# Patient Record
Sex: Male | Born: 1935 | Race: White | Hispanic: No | Marital: Married
Health system: Southern US, Community
[De-identification: ages and names within clinical notes are randomized; demographics above are authoritative.]

## PROBLEM LIST (undated history)

## (undated) DIAGNOSIS — I10 Essential (primary) hypertension: Secondary | ICD-10-CM

## (undated) DIAGNOSIS — I2510 Atherosclerotic heart disease of native coronary artery without angina pectoris: Secondary | ICD-10-CM

## (undated) DIAGNOSIS — F0390 Unspecified dementia without behavioral disturbance: Secondary | ICD-10-CM

## (undated) HISTORY — PX: NO PAST SURGERIES: SHX2092

---

## 2019-06-15 ENCOUNTER — Encounter: Payer: Self-pay | Admitting: Emergency Medicine

## 2019-06-15 ENCOUNTER — Other Ambulatory Visit: Payer: Self-pay

## 2019-06-15 ENCOUNTER — Emergency Department
Admission: EM | Admit: 2019-06-15 | Discharge: 2019-06-15 | Disposition: A | Discharge status: Skilled Nursing Facility | Payer: Medicare Other | Attending: Emergency Medicine | Admitting: Emergency Medicine

## 2019-06-15 ENCOUNTER — Emergency Department: Payer: Medicare Other

## 2019-06-15 DIAGNOSIS — M542 Cervicalgia: Secondary | ICD-10-CM | POA: Insufficient documentation

## 2019-06-15 DIAGNOSIS — Z79899 Other long term (current) drug therapy: Secondary | ICD-10-CM | POA: Insufficient documentation

## 2019-06-15 DIAGNOSIS — I2510 Atherosclerotic heart disease of native coronary artery without angina pectoris: Secondary | ICD-10-CM | POA: Diagnosis not present

## 2019-06-15 DIAGNOSIS — W01190A Fall on same level from slipping, tripping and stumbling with subsequent striking against furniture, initial encounter: Secondary | ICD-10-CM | POA: Insufficient documentation

## 2019-06-15 DIAGNOSIS — F0390 Unspecified dementia without behavioral disturbance: Secondary | ICD-10-CM | POA: Diagnosis not present

## 2019-06-15 DIAGNOSIS — Z7901 Long term (current) use of anticoagulants: Secondary | ICD-10-CM | POA: Diagnosis not present

## 2019-06-15 DIAGNOSIS — W19XXXA Unspecified fall, initial encounter: Secondary | ICD-10-CM

## 2019-06-15 DIAGNOSIS — I10 Essential (primary) hypertension: Secondary | ICD-10-CM | POA: Diagnosis not present

## 2019-06-15 DIAGNOSIS — Z7982 Long term (current) use of aspirin: Secondary | ICD-10-CM | POA: Diagnosis not present

## 2019-06-15 HISTORY — DX: Unspecified dementia, unspecified severity, without behavioral disturbance, psychotic disturbance, mood disturbance, and anxiety: F03.90

## 2019-06-15 HISTORY — DX: Atherosclerotic heart disease of native coronary artery without angina pectoris: I25.10

## 2019-06-15 HISTORY — DX: Essential (primary) hypertension: I10

## 2019-06-15 NOTE — ED Provider Notes (Signed)
[facility] Emergency Department Provider Note   ____________________________________________    I have reviewed the triage vital signs and the nursing notes.   HISTORY  Chief Complaint Fall     HPI Glenn Barker is a 85 y.o. male who presents after a fall.  Patient has a history of dementia, history significantly limited because of this.  EMS reports the patient is [facility] after mechanical fall apparently hit his head on a table initially complained of neck pain  Past Medical History:  Diagnosis Date  . Coronary artery disease   . Dementia (HCC)   . Hypertension     There are no active problems to display for this patient.   History reviewed. No pertinent surgical history.  Prior to Admission medications   Medication Sig Start Date End Date Taking? Authorizing Provider  acetaminophen (TYLENOL) 500 MG tablet Take 1,000 mg by mouth 3 (three) times daily.   Yes [provider]  amLODipine (NORVASC) 10 MG tablet Take 10 mg by mouth daily.   Yes [provider]  aspirin EC 81 MG tablet Take 81 mg by mouth every evening.   Yes [provider]  atorvastatin (LIPITOR) 40 MG tablet Take 40 mg by mouth daily.   Yes [provider]  Camphor-Eucalyptus-Menthol (ICY HOT NO-MESS VAPOR GEL EX) Apply 1 application topically 3 (three) times daily.   Yes [provider]  cholecalciferol (VITAMIN D3) 25 MCG (1000 UT) tablet Take 2,000 Units by mouth daily.   Yes [provider]  clopidogrel (PLAVIX) 75 MG tablet Take 75 mg by mouth daily.   Yes [provider]  lisinopril (ZESTRIL) 20 MG tablet Take 20 mg by mouth daily.   Yes [provider]  metoprolol succinate (TOPROL-XL) 50 MG 24 hr tablet Take 50 mg by mouth every evening.   Yes [provider]  potassium chloride SA (K-DUR) 20 MEQ tablet Take 20 mEq by mouth 2 (two) times daily.   Yes [provider]  QUEtiapine (SEROQUEL) 25 MG tablet Take 12.5 mg by mouth 2 (two) times daily.   Yes [provider]  traMADol (ULTRAM) 50 MG tablet Take 50 mg by mouth 2 (two) times daily.   Yes [provider]  venlafaxine (EFFEXOR) 75 MG tablet Take 75 mg by mouth 2 (two) times daily.   Yes [provider]  vitamin B-12 (CYANOCOBALAMIN) 1000 MCG tablet Take 1,000 mcg by mouth daily.   Yes [provider]     Allergies Gabapentin and Pregabalin  No family history on file.  Social History Social History   Tobacco Use  . Smoking status: Unknown If Ever Smoked  Substance Use Topics  . Alcohol use: Not on file  . Drug use: Not on file    Review of Systems limited by dementia     ____________________________________________   PHYSICAL EXAM:  VITAL SIGNS: ED Triage Vitals  Enc Vitals Group     BP 06/15/19 1544 (!) 188/61     Pulse Rate 06/15/19 1544 (!) 53     Resp 06/15/19 1544 18     Temp 06/15/19 1544 98.1 F (36.7 C)     Temp src --      SpO2 06/15/19 1544 98 %     Weight 06/15/19 1545 81.6 kg (180 lb)     Height 06/15/19 1545 1.829 m (6')     Head Circumference --      Peak Flow --  Pain Score 06/15/19 1545 0     Pain Loc --      Pain Edu? --      Excl. in [place]? --     Constitutional: Alert, no acute distress Eyes: Conjunctivae are normal.  Head: No no hematoma, no lacerations Nose: No no epistaxis or swelling Mouth/Throat: Mucous membranes are moist.   Neck: C-collar in place, no vertebral tenderness to palpation, no apparent bruising hematoma or injury to the neck.  No bruit Cardiovascular: Mild bradycardia, regular rhythm   good peripheral circulation.  No chest wall tenderness palpation Respiratory: Normal respiratory effort.  No retractions. Gastrointestinal: Soft and nontender. No distention.  No CVA tenderness.  Musculoskeletal: Normal range of motion of the extremities without pain, no vertebral tenderness to  palpation, no pain with axial load on both hips Neurologic:  Normal speech and language. No gross focal neurologic deficits are appreciated.  Skin:  Skin is warm, dry and intact.  No bruising or hematomas noted Psychiatric: Mood and affect are normal. Speech and behavior are normal.  ____________________________________________   LABS (all labs ordered are listed, but only abnormal results are displayed)  Labs Reviewed - No data to display ____________________________________________  EKG  None ____________________________________________  RADIOLOGY  CT head cervical spine unremarkable ____________________________________________   PROCEDURES  Procedure(s) performed: No  Procedures   Critical Care performed: No ____________________________________________   INITIAL IMPRESSION / ASSESSMENT AND PLAN / ED COURSE  Pertinent labs & imaging results that were available during my care of the patient were reviewed by me and considered in my medical decision making (see chart for details).  Patient presents after mechanical fall, exam is overall reassuring, will obtain imaging of the head and cervical spine given description of head injury and possible neck pain.  CT imaging is reassuring, patient's exam is otherwise unremarkable, appropriate for discharge at this time    ____________________________________________   FINAL CLINICAL IMPRESSION(S) / ED DIAGNOSES  Final diagnoses:  Fall, initial encounter        Note:  This document was prepared using Dragon voice recognition software and may include unintentional dictation errors.   Lavonia Drafts, MD 06/15/19 1739

## 2019-06-15 NOTE — Discharge Instructions (Addendum)
Patient had reassuring ct imaging of head and neck

## 2019-06-15 NOTE — ED Triage Notes (Signed)
Pt to ER via EMS from [facility] after tripping and falling.  Pt with hx of dementia, unable to obtain any information from patient concerning fall at this time.  Reports that pt hit his head on a table as he fell, reports of a hematoma to the side of his neck, c-collar in place covering area in question.

## 2019-08-05 ENCOUNTER — Emergency Department: Payer: Medicare Other

## 2019-08-05 ENCOUNTER — Emergency Department
Admission: EM | Admit: 2019-08-05 | Discharge: 2019-08-05 | Disposition: A | Discharge status: Home / Self Care | Payer: Medicare Other | Attending: Emergency Medicine | Admitting: Emergency Medicine

## 2019-08-05 ENCOUNTER — Other Ambulatory Visit: Payer: Self-pay

## 2019-08-05 DIAGNOSIS — Z7901 Long term (current) use of anticoagulants: Secondary | ICD-10-CM | POA: Diagnosis not present

## 2019-08-05 DIAGNOSIS — Z7982 Long term (current) use of aspirin: Secondary | ICD-10-CM | POA: Insufficient documentation

## 2019-08-05 DIAGNOSIS — Z79899 Other long term (current) drug therapy: Secondary | ICD-10-CM | POA: Diagnosis not present

## 2019-08-05 DIAGNOSIS — R51 Headache: Secondary | ICD-10-CM | POA: Diagnosis present

## 2019-08-05 DIAGNOSIS — I10 Essential (primary) hypertension: Secondary | ICD-10-CM | POA: Diagnosis not present

## 2019-08-05 DIAGNOSIS — I2510 Atherosclerotic heart disease of native coronary artery without angina pectoris: Secondary | ICD-10-CM | POA: Insufficient documentation

## 2019-08-05 DIAGNOSIS — Y999 Unspecified external cause status: Secondary | ICD-10-CM | POA: Diagnosis not present

## 2019-08-05 DIAGNOSIS — F0390 Unspecified dementia without behavioral disturbance: Secondary | ICD-10-CM | POA: Diagnosis not present

## 2019-08-05 DIAGNOSIS — W19XXXA Unspecified fall, initial encounter: Secondary | ICD-10-CM | POA: Diagnosis not present

## 2019-08-05 DIAGNOSIS — Y92129 Unspecified place in nursing home as the place of occurrence of the external cause: Secondary | ICD-10-CM | POA: Insufficient documentation

## 2019-08-05 LAB — COMPREHENSIVE METABOLIC PANEL
ALT: 11 U/L (ref 0–44)
AST: 18 U/L (ref 15–41)
Albumin: 4.3 g/dL (ref 3.5–5.0)
Alkaline Phosphatase: 52 U/L (ref 38–126)
Anion gap: 8 (ref 5–15)
BUN: 19 mg/dL (ref 8–23)
CO2: 28 mmol/L (ref 22–32)
Calcium: 9.7 mg/dL (ref 8.9–10.3)
Chloride: 102 mmol/L (ref 98–111)
Creatinine, Ser: 1.07 mg/dL (ref 0.61–1.24)
GFR calc Af Amer: >60 mL/min (ref >60)
GFR calc non Af Amer: >60 mL/min (ref >60)
Glucose, Bld: 107 mg/dL — ABNORMAL HIGH (ref 70–99)
Potassium: 4.5 mmol/L (ref 3.5–5.1)
Sodium: 138 mmol/L (ref 135–145)
Total Bilirubin: 0.8 mg/dL (ref 0.3–1.2)
Total Protein: 6.9 g/dL (ref 6.5–8.1)

## 2019-08-05 LAB — CBC
HCT: 37.6 % — ABNORMAL LOW (ref 39.0–52.0)
Hemoglobin: 12.3 g/dL — ABNORMAL LOW (ref 13.0–17.0)
MCH: 33.3 pg (ref 26.0–34.0)
MCHC: 32.7 g/dL (ref 30.0–36.0)
MCV: 101.9 fL — ABNORMAL HIGH (ref 80.0–100.0)
Platelets: 240 10*3/uL (ref 150–400)
RBC: 3.69 MIL/uL — ABNORMAL LOW (ref 4.22–5.81)
RDW: 13.2 % (ref 11.5–15.5)
WBC: 6.3 10*3/uL (ref 4.0–10.5)
nRBC: 0 % (ref 0.0–0.2)

## 2019-08-05 NOTE — ED Notes (Signed)
Assisted pt in using the urinal.AS

## 2019-08-05 NOTE — ED Provider Notes (Signed)
[facility] Emergency Department Provider Note   ____________________________________________    I have reviewed the triage vital signs and the nursing notes.   HISTORY  Chief Complaint Fall and Headache  History limited by dementia   HPI Glenn Barker is a 85 y.o. male who presents after an unwitnessed fall.  Patient has a history of dementia, unable to provide any significant history.  Reportedly found down by nursing home staff.  Patient complains of some pain on the right side of head but otherwise has no complaints.  He is on Plavix  Past Medical History:  Diagnosis Date  . Coronary artery disease   . Dementia ([place])   . Hypertension     There are no active problems to display for this patient.   History reviewed. No pertinent surgical history.  Prior to Admission medications   Medication Sig Start Date End Date Taking? Authorizing Provider  acetaminophen (TYLENOL) 500 MG tablet Take 1,000 mg by mouth 3 (three) times daily.    [provider]  amLODipine (NORVASC) 10 MG tablet Take 10 mg by mouth daily.    [provider]  aspirin EC 81 MG tablet Take 81 mg by mouth every evening.    [provider]  atorvastatin (LIPITOR) 40 MG tablet Take 40 mg by mouth daily.    [provider]  Camphor-Eucalyptus-Menthol (ICY HOT NO-MESS VAPOR GEL EX) Apply 1 application topically 3 (three) times daily.    [provider]  cholecalciferol (VITAMIN D3) 25 MCG (1000 UT) tablet Take 2,000 Units by mouth daily.    [provider]  clopidogrel (PLAVIX) 75 MG tablet Take 75 mg by mouth daily.    [provider]  lisinopril (ZESTRIL) 20 MG tablet Take 20 mg by mouth daily.    [provider]  metoprolol succinate (TOPROL-XL) 50 MG 24 hr tablet Take 50 mg by mouth every evening.    [provider]  potassium chloride SA (K-DUR) 20 MEQ tablet Take 20 mEq by mouth 2 (two) times daily.     [provider]  QUEtiapine (SEROQUEL) 25 MG tablet Take 12.5 mg by mouth 2 (two) times daily.    [provider]  traMADol (ULTRAM) 50 MG tablet Take 50 mg by mouth 2 (two) times daily.    [provider]  venlafaxine (EFFEXOR) 75 MG tablet Take 75 mg by mouth 2 (two) times daily.    [provider]  vitamin B-12 (CYANOCOBALAMIN) 1000 MCG tablet Take 1,000 mcg by mouth daily.    [provider]     Allergies Gabapentin and Pregabalin  No family history on file.  Social History Social History   Tobacco Use  . Smoking status: Unknown If Ever Smoked  . Smokeless tobacco: Never Used  Substance Use Topics  . Alcohol use: Never    Frequency: Never  . Drug use: Never    Review of Systems limited by dementia    ____________________________________________   PHYSICAL EXAM:  VITAL SIGNS: ED Triage Vitals  Enc Vitals Group     BP 08/05/19 1715 (!) 171/100     Pulse Rate 08/05/19 1715 (!) 54     Resp 08/05/19 1715 16     Temp 08/05/19 1715 97.7 F (36.5 C)     Temp Source 08/05/19 1715 Oral     SpO2 08/05/19 1715 100 %     Weight 08/05/19 1712 77.1 kg (170 lb)     Height 08/05/19 1712 1.803  m (5\' 11" )     Head Circumference --      Peak Flow --      Pain Score 08/05/19 1712 7     Pain Loc --      Pain Edu? --      Excl. in [place]? --     Constitutional: Alert, disoriented at baseline Eyes: Conjunctivae are normal.  Head: Atraumatic.  No hematoma or laceration Nose: No swelling or epistaxis Mouth/Throat: Mucous membranes are moist.   Neck:  Painless ROM, no vertebral tenderness to palpation Cardiovascular: Normal rate, regular rhythm.  Good peripheral circulation.  No chest wall tenderness palpation Respiratory: Normal respiratory effort.  No retractions.  Gastrointestinal: Soft and nontender. No distention.    Musculoskeletal: Normal range of motion of all extremities, no pain with axial load on both hips.  No pelvic  tenderness to palpation.  All extremities warm and well perfused Neurologic:  Normal speech and language. No gross focal neurologic deficits are appreciated.  Skin:  Skin is warm, dry and intact. No rash noted. Psychiatric: Mood and affect are normal. Speech and behavior are normal.  ____________________________________________   LABS (all labs ordered are listed, but only abnormal results are displayed)  Labs Reviewed  CBC - Abnormal; Notable for the following components:      Result Value   RBC 3.69 (*)    Hemoglobin 12.3 (*)    HCT 37.6 (*)    MCV 101.9 (*)    All other components within normal limits  COMPREHENSIVE METABOLIC PANEL - Abnormal; Notable for the following components:   Glucose, Bld 107 (*)    All other components within normal limits   ____________________________________________  EKG  ED ECG REPORT I, Jene Everyobert Sukari Grist, the attending physician, personally viewed and interpreted this ECG.  Date: 08/05/2019  Rhythm: normal sinus rhythm QRS Axis: normal Intervals: normal ST/T Wave abnormalities: normal Narrative Interpretation: no evidence of acute ischemia  ____________________________________________  RADIOLOGY  CT head cervical spine ____________________________________________   PROCEDURES  Procedure(s) performed: No  Procedures   Critical Care performed: No ____________________________________________   INITIAL IMPRESSION / ASSESSMENT AND PLAN / ED COURSE  Pertinent labs & imaging results that were available during my care of the patient were reviewed by me and considered in my medical decision making (see chart for details).  Patient with unwitnessed fall, overall well-appearing and in no acute distress.  Exam is reassuring, pending CT imaging, labs.  EKG is unremarkable.  Lab work is quite reassuring, CT scan unremarkable, no acute injury.  Patient appears to be at his baseline, no evidence of musculoskeletal injury.  Appropriate for  discharge at this time    ____________________________________________   FINAL CLINICAL IMPRESSION(S) / ED DIAGNOSES  Final diagnoses:  Fall, initial encounter  Dementia without behavioral disturbance, unspecified dementia type (HCC)        Note:  This document was prepared using Dragon voice recognition software and may include unintentional dictation errors.   Jene EveryKinner, Dorinne Graeff, MD 08/05/19 [phone]

## 2019-08-05 NOTE — ED Notes (Signed)
Assisted pt to use urinal to void 

## 2019-08-05 NOTE — ED Triage Notes (Signed)
Pt arrived via ems from [facility] for report of unwitnessed fall today - pt reports that he hit his head but is unsure if he lost consciousness - is currently c/o right sided headache/facial pain - pt has dementia

## 2019-08-05 NOTE — ED Notes (Signed)
Report given to [place], Med Tech at [facility]

## 2019-09-12 ENCOUNTER — Other Ambulatory Visit: Payer: Self-pay

## 2019-09-12 ENCOUNTER — Emergency Department: Payer: Medicare Other

## 2019-09-12 ENCOUNTER — Emergency Department
Admission: EM | Admit: 2019-09-12 | Discharge: 2019-09-13 | Disposition: A | Discharge status: Skilled Nursing Facility | Payer: Medicare Other | Attending: Emergency Medicine | Admitting: Emergency Medicine

## 2019-09-12 DIAGNOSIS — Z79899 Other long term (current) drug therapy: Secondary | ICD-10-CM | POA: Diagnosis not present

## 2019-09-12 DIAGNOSIS — I10 Essential (primary) hypertension: Secondary | ICD-10-CM | POA: Insufficient documentation

## 2019-09-12 DIAGNOSIS — R0789 Other chest pain: Secondary | ICD-10-CM | POA: Diagnosis not present

## 2019-09-12 DIAGNOSIS — F0390 Unspecified dementia without behavioral disturbance: Secondary | ICD-10-CM | POA: Diagnosis not present

## 2019-09-12 DIAGNOSIS — Z8659 Personal history of other mental and behavioral disorders: Secondary | ICD-10-CM

## 2019-09-12 DIAGNOSIS — Z7901 Long term (current) use of anticoagulants: Secondary | ICD-10-CM | POA: Diagnosis not present

## 2019-09-12 DIAGNOSIS — R079 Chest pain, unspecified: Secondary | ICD-10-CM

## 2019-09-12 LAB — BASIC METABOLIC PANEL
Anion gap: 10 (ref 5–15)
BUN: 14 mg/dL (ref 8–23)
CO2: 29 mmol/L (ref 22–32)
Calcium: 9.3 mg/dL (ref 8.9–10.3)
Chloride: 102 mmol/L (ref 98–111)
Creatinine, Ser: 0.69 mg/dL (ref 0.61–1.24)
GFR calc Af Amer: >60 mL/min (ref >60)
GFR calc non Af Amer: >60 mL/min (ref >60)
Glucose, Bld: 120 mg/dL — ABNORMAL HIGH (ref 70–99)
Potassium: 3.6 mmol/L (ref 3.5–5.1)
Sodium: 141 mmol/L (ref 135–145)

## 2019-09-12 LAB — URINALYSIS, COMPLETE (UACMP) WITH MICROSCOPIC
Bacteria, UA: NONE SEEN
Bilirubin Urine: NEGATIVE
Glucose, UA: NEGATIVE mg/dL
Ketones, ur: NEGATIVE mg/dL
Leukocytes,Ua: NEGATIVE
Nitrite: NEGATIVE
Protein, ur: NEGATIVE mg/dL
Specific Gravity, Urine: 1.01 (ref 1.005–1.030)
Squamous Epithelial / HPF: NONE SEEN (ref 0–5)
WBC, UA: NONE SEEN WBC/hpf (ref 0–5)
pH: 7 (ref 5.0–8.0)

## 2019-09-12 LAB — CBC WITH DIFFERENTIAL/PLATELET
Abs Immature Granulocytes: 0.02 10*3/uL (ref 0.00–0.07)
Basophils Absolute: 0 10*3/uL (ref 0.0–0.1)
Basophils Relative: 1 %
Eosinophils Absolute: 0 10*3/uL (ref 0.0–0.5)
Eosinophils Relative: 1 %
HCT: 37.1 % — ABNORMAL LOW (ref 39.0–52.0)
Hemoglobin: 12.1 g/dL — ABNORMAL LOW (ref 13.0–17.0)
Immature Granulocytes: 0 %
Lymphocytes Relative: 19 %
Lymphs Abs: 1.2 10*3/uL (ref 0.7–4.0)
MCH: 32.5 pg (ref 26.0–34.0)
MCHC: 32.6 g/dL (ref 30.0–36.0)
MCV: 99.7 fL (ref 80.0–100.0)
Monocytes Absolute: 0.6 10*3/uL (ref 0.1–1.0)
Monocytes Relative: 9 %
Neutro Abs: 4.6 10*3/uL (ref 1.7–7.7)
Neutrophils Relative %: 70 %
Platelets: 264 10*3/uL (ref 150–400)
RBC: 3.72 MIL/uL — ABNORMAL LOW (ref 4.22–5.81)
RDW: 12.9 % (ref 11.5–15.5)
WBC: 6.6 10*3/uL (ref 4.0–10.5)
nRBC: 0 % (ref 0.0–0.2)

## 2019-09-12 LAB — TROPONIN I (HIGH SENSITIVITY): Troponin I (High Sensitivity): 8 ng/L (ref <18)

## 2019-09-12 NOTE — ED Notes (Addendum)
Yellow high fall armband placed on pt's right wrist, yellow socks applied, and Posey alarm activated at this time.

## 2019-09-12 NOTE — ED Provider Notes (Signed)
[facility] Emergency Department Provider Note   ____________________________________________   First MD Initiated Contact with Patient 09/12/19 2012     (approximate)  I have reviewed the triage vital signs and the nursing notes.   HISTORY  Chief Complaint Chest Pain    HPI Glenn Barker is a 85 y.o. male with past medical history of hypertension, dementia, CAD presents to the ED for chest pain.  History is limited secondary to patient's dementia.  Per EMS, he had called out to staff that he was having pain in his chest as well as ribs.  EMS states that patient had just got done talking with his wife over the phone and had become upset prior to complaining of chest pain.  He had not complained of any shortness of breath, has not had any recent fevers or cough.  Upon EMS arrival, patient denied any further chest pain and he denies any chest pain upon arrival in the ED.  Patient currently states he feels well and denies any complaints.        Past Medical History:  Diagnosis Date  . Coronary artery disease   . Dementia ([place])   . Hypertension     There are no active problems to display for this patient.   History reviewed. No pertinent surgical history.  Prior to Admission medications   Medication Sig Start Date End Date Taking? Authorizing Provider  acetaminophen (TYLENOL) 500 MG tablet Take 1,000 mg by mouth 3 (three) times daily.   Yes [provider]  amLODipine (NORVASC) 10 MG tablet Take 10 mg by mouth daily.   Yes [provider]  aspirin EC 81 MG tablet Take 81 mg by mouth every evening.   Yes [provider]  atorvastatin (LIPITOR) 40 MG tablet Take 40 mg by mouth daily.   Yes [provider]  cholecalciferol (VITAMIN D3) 25 MCG (1000 UT) tablet Take 2,000 Units by mouth daily.   Yes [provider]  clopidogrel (PLAVIX) 75 MG tablet Take 75 mg by mouth daily.   Yes [provider]   lisinopril (ZESTRIL) 20 MG tablet Take 20 mg by mouth daily.   Yes [provider]  metoprolol succinate (TOPROL-XL) 50 MG 24 hr tablet Take 50 mg by mouth every evening.   Yes [provider]  potassium chloride SA (K-DUR) 20 MEQ tablet Take 20 mEq by mouth 2 (two) times daily.   Yes [provider]  QUEtiapine (SEROQUEL) 25 MG tablet Take 12.5 mg by mouth 2 (two) times daily.   Yes [provider]  traMADol (ULTRAM) 50 MG tablet Take 50 mg by mouth 2 (two) times daily.   Yes [provider]  venlafaxine (EFFEXOR) 75 MG tablet Take 75 mg by mouth 2 (two) times daily.   Yes [provider]  vitamin B-12 (CYANOCOBALAMIN) 1000 MCG tablet Take 1,000 mcg by mouth daily.   Yes [provider]  Camphor-Eucalyptus-Menthol (ICY HOT NO-MESS VAPOR GEL EX) Apply 1 application topically 3 (three) times daily.    [provider]    Allergies Gabapentin and Pregabalin  No family history on file.  Social History Social History   Tobacco Use  . Smoking status: Unknown If Ever Smoked  . Smokeless tobacco: Never Used  Substance Use Topics  . Alcohol use: Never    Frequency: Never  . Drug use: Never    Review of Systems  Constitutional: No fever/chills Eyes: No visual changes. ENT: No sore throat. Cardiovascular:  Positive for chest pain. Respiratory: Denies shortness of breath. Gastrointestinal: No abdominal pain.  No nausea, no vomiting.  No diarrhea.  No constipation. Genitourinary: Negative for dysuria. Musculoskeletal: Negative for back pain. Skin: Negative for rash. Neurological: Negative for headaches, focal weakness or numbness.  ____________________________________________   PHYSICAL EXAM:  VITAL SIGNS: ED Triage Vitals  Enc Vitals Group     BP      Pulse      Resp      Temp      Temp src      SpO2      Weight      Height      Head Circumference      Peak Flow      Pain Score      Pain Loc       Pain Edu?      Excl. in [place]?     Constitutional: Alert and oriented. Eyes: Conjunctivae are normal. Head: Atraumatic. Nose: No congestion/rhinnorhea. Mouth/Throat: Mucous membranes are moist. Neck: Normal ROM Cardiovascular: Normal rate, regular rhythm. Grossly normal heart sounds. Respiratory: Normal respiratory effort.  No retractions. Lungs CTAB. Gastrointestinal: Soft and nontender. No distention. Genitourinary: deferred Musculoskeletal: No lower extremity tenderness nor edema. Neurologic:  Normal speech and language. No gross focal neurologic deficits are appreciated. Skin:  Skin is warm, dry and intact. No rash noted. Psychiatric: Mood and affect are normal. Speech and behavior are normal.  ____________________________________________   LABS (all labs ordered are listed, but only abnormal results are displayed)  Labs Reviewed  URINALYSIS, COMPLETE (UACMP) WITH MICROSCOPIC - Abnormal; Notable for the following components:      Result Value   Color, Urine STRAW (*)    APPearance CLEAR (*)    Hgb urine dipstick SMALL (*)    All other components within normal limits  CBC WITH DIFFERENTIAL/PLATELET - Abnormal; Notable for the following components:   RBC 3.72 (*)    Hemoglobin 12.1 (*)    HCT 37.1 (*)    All other components within normal limits  BASIC METABOLIC PANEL - Abnormal; Notable for the following components:   Glucose, Bld 120 (*)    All other components within normal limits  TROPONIN I (HIGH SENSITIVITY)  TROPONIN I (HIGH SENSITIVITY)   ____________________________________________  EKG  ED ECG REPORT I, Chesley Noonharles Saragrace Selke, the attending physician, personally viewed and interpreted this ECG.   Date: 09/13/2019  EKG Time: 20:26  Rate: 56  Rhythm: normal sinus rhythm  Axis: Normal  Intervals:none  ST&T Change: None    PROCEDURES  Procedure(s) performed (including Critical Care):  Procedures   ____________________________________________   INITIAL  IMPRESSION / ASSESSMENT AND PLAN / ED COURSE       85 year old male presenting to the ED with concern for chest pain after he called out to staff at his nursing facility.  He denied chest pain during EMS transport as well as during arrival in the ED.  EKG without acute ischemic changes and initial troponin negative.  Chest x-ray negative for acute process and remainder of labs unremarkable.  Low suspicion of ACS as patient has no significant planes at this time, will check second set troponin and if this is negative he would be appropriate for discharge home.  Repeat troponin within normal limits, patient continues to deny chest pain or any other symptoms.  Will discharge back to nursing facility.      ____________________________________________   FINAL CLINICAL IMPRESSION(S) / ED DIAGNOSES  Final diagnoses:  Chest pain, unspecified  type  History of dementia     ED Discharge Orders    None       Note:  This document was prepared using Dragon voice recognition software and may include unintentional dictation errors.   Chesley Noon, MD 09/13/19 [phone]
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
## 2019-09-13 LAB — TROPONIN I (HIGH SENSITIVITY): Troponin I (High Sensitivity): 9 ng/L (ref <18)

## 2019-09-13 NOTE — ED Notes (Signed)
REPORT GIVEN TO [place], MED TECH AT [place]

## 2020-01-07 ENCOUNTER — Emergency Department: Payer: Medicare Other

## 2020-01-07 ENCOUNTER — Other Ambulatory Visit: Payer: Self-pay

## 2020-01-07 ENCOUNTER — Inpatient Hospital Stay
Admission: EM | Admit: 2020-01-07 | Discharge: 2020-01-11 | DRG: 871 | Disposition: A | Discharge status: Nursing Home (Includes ICF) | Payer: Medicare Other | Source: Skilled Nursing Facility | Attending: Internal Medicine | Admitting: Internal Medicine

## 2020-01-07 ENCOUNTER — Encounter: Payer: Self-pay | Admitting: Internal Medicine

## 2020-01-07 DIAGNOSIS — R6510 Systemic inflammatory response syndrome (SIRS) of non-infectious origin without acute organ dysfunction: Secondary | ICD-10-CM | POA: Diagnosis present

## 2020-01-07 DIAGNOSIS — R1011 Right upper quadrant pain: Secondary | ICD-10-CM | POA: Diagnosis not present

## 2020-01-07 DIAGNOSIS — A419 Sepsis, unspecified organism: Principal | ICD-10-CM | POA: Diagnosis present

## 2020-01-07 DIAGNOSIS — Z66 Do not resuscitate: Secondary | ICD-10-CM | POA: Diagnosis present

## 2020-01-07 DIAGNOSIS — E872 Acidosis, unspecified: Secondary | ICD-10-CM

## 2020-01-07 DIAGNOSIS — Z7982 Long term (current) use of aspirin: Secondary | ICD-10-CM

## 2020-01-07 DIAGNOSIS — Z79899 Other long term (current) drug therapy: Secondary | ICD-10-CM | POA: Diagnosis not present

## 2020-01-07 DIAGNOSIS — L03115 Cellulitis of right lower limb: Secondary | ICD-10-CM | POA: Diagnosis present

## 2020-01-07 DIAGNOSIS — R6520 Severe sepsis without septic shock: Secondary | ICD-10-CM | POA: Diagnosis present

## 2020-01-07 DIAGNOSIS — Z20822 Contact with and (suspected) exposure to covid-19: Secondary | ICD-10-CM | POA: Diagnosis present

## 2020-01-07 DIAGNOSIS — Z8249 Family history of ischemic heart disease and other diseases of the circulatory system: Secondary | ICD-10-CM | POA: Diagnosis not present

## 2020-01-07 DIAGNOSIS — R109 Unspecified abdominal pain: Secondary | ICD-10-CM

## 2020-01-07 DIAGNOSIS — I2510 Atherosclerotic heart disease of native coronary artery without angina pectoris: Secondary | ICD-10-CM | POA: Diagnosis present

## 2020-01-07 DIAGNOSIS — I10 Essential (primary) hypertension: Secondary | ICD-10-CM | POA: Diagnosis present

## 2020-01-07 DIAGNOSIS — F03918 Unspecified dementia, unspecified severity, with other behavioral disturbance: Secondary | ICD-10-CM | POA: Diagnosis present

## 2020-01-07 DIAGNOSIS — Z888 Allergy status to other drugs, medicaments and biological substances status: Secondary | ICD-10-CM | POA: Diagnosis not present

## 2020-01-07 DIAGNOSIS — G9340 Encephalopathy, unspecified: Secondary | ICD-10-CM | POA: Diagnosis not present

## 2020-01-07 DIAGNOSIS — G9341 Metabolic encephalopathy: Secondary | ICD-10-CM | POA: Diagnosis present

## 2020-01-07 DIAGNOSIS — E876 Hypokalemia: Secondary | ICD-10-CM | POA: Diagnosis present

## 2020-01-07 DIAGNOSIS — F0391 Unspecified dementia with behavioral disturbance: Secondary | ICD-10-CM | POA: Diagnosis present

## 2020-01-07 DIAGNOSIS — Z7902 Long term (current) use of antithrombotics/antiplatelets: Secondary | ICD-10-CM

## 2020-01-07 LAB — RESPIRATORY PANEL BY RT PCR (FLU A&B, COVID)
Influenza A by PCR: NEGATIVE
Influenza B by PCR: NEGATIVE
SARS Coronavirus 2 by RT PCR: NEGATIVE

## 2020-01-07 LAB — CREATININE, SERUM
Creatinine, Ser: 0.86 mg/dL (ref 0.61–1.24)
GFR calc Af Amer: >60 mL/min (ref >60)
GFR calc non Af Amer: >60 mL/min (ref >60)

## 2020-01-07 LAB — URINALYSIS, COMPLETE (UACMP) WITH MICROSCOPIC
Bacteria, UA: NONE SEEN
Bilirubin Urine: NEGATIVE
Glucose, UA: NEGATIVE mg/dL
Ketones, ur: NEGATIVE mg/dL
Leukocytes,Ua: NEGATIVE
Nitrite: NEGATIVE
Protein, ur: 30 mg/dL — AB
Specific Gravity, Urine: 1.021 (ref 1.005–1.030)
pH: 5 (ref 5.0–8.0)

## 2020-01-07 LAB — COMPREHENSIVE METABOLIC PANEL
ALT: 12 U/L (ref 0–44)
AST: 21 U/L (ref 15–41)
Albumin: 3.7 g/dL (ref 3.5–5.0)
Alkaline Phosphatase: 50 U/L (ref 38–126)
Anion gap: 7 (ref 5–15)
BUN: 18 mg/dL (ref 8–23)
CO2: 30 mmol/L (ref 22–32)
Calcium: 9.4 mg/dL (ref 8.9–10.3)
Chloride: 100 mmol/L (ref 98–111)
Creatinine, Ser: 0.99 mg/dL (ref 0.61–1.24)
GFR calc Af Amer: >60 mL/min (ref >60)
GFR calc non Af Amer: >60 mL/min (ref >60)
Glucose, Bld: 140 mg/dL — ABNORMAL HIGH (ref 70–99)
Potassium: 4.1 mmol/L (ref 3.5–5.1)
Sodium: 137 mmol/L (ref 135–145)
Total Bilirubin: 0.6 mg/dL (ref 0.3–1.2)
Total Protein: 6.9 g/dL (ref 6.5–8.1)

## 2020-01-07 LAB — CBC WITH DIFFERENTIAL/PLATELET
Abs Immature Granulocytes: 0.04 10*3/uL (ref 0.00–0.07)
Basophils Absolute: 0 10*3/uL (ref 0.0–0.1)
Basophils Relative: 0 %
Eosinophils Absolute: 0 10*3/uL (ref 0.0–0.5)
Eosinophils Relative: 0 %
HCT: 37.7 % — ABNORMAL LOW (ref 39.0–52.0)
Hemoglobin: 12.5 g/dL — ABNORMAL LOW (ref 13.0–17.0)
Immature Granulocytes: 0 %
Lymphocytes Relative: 4 %
Lymphs Abs: 0.5 10*3/uL — ABNORMAL LOW (ref 0.7–4.0)
MCH: 33.2 pg (ref 26.0–34.0)
MCHC: 33.2 g/dL (ref 30.0–36.0)
MCV: 100 fL (ref 80.0–100.0)
Monocytes Absolute: 0.7 10*3/uL (ref 0.1–1.0)
Monocytes Relative: 6 %
Neutro Abs: 11 10*3/uL — ABNORMAL HIGH (ref 1.7–7.7)
Neutrophils Relative %: 90 %
Platelets: 227 10*3/uL (ref 150–400)
RBC: 3.77 MIL/uL — ABNORMAL LOW (ref 4.22–5.81)
RDW: 13.1 % (ref 11.5–15.5)
WBC: 12.2 10*3/uL — ABNORMAL HIGH (ref 4.0–10.5)
nRBC: 0 % (ref 0.0–0.2)

## 2020-01-07 LAB — LIPASE, BLOOD: Lipase: 15 U/L (ref 11–51)

## 2020-01-07 LAB — LACTIC ACID, PLASMA
Lactic Acid, Venous: 2.1 mmol/L (ref 0.5–1.9)
Lactic Acid, Venous: 2.1 mmol/L (ref 0.5–1.9)

## 2020-01-07 MED ORDER — IOHEXOL 300 MG/ML  SOLN
100.0000 mL | Freq: Once | INTRAMUSCULAR | Status: AC | PRN
  Administered 2020-01-07: 100 mL via INTRAVENOUS

## 2020-01-07 MED ORDER — LORAZEPAM 2 MG/ML IJ SOLN
0.5000 mg | Freq: Once | INTRAMUSCULAR | Status: AC
  Administered 2020-01-07: 16:00:00 0.5 mg via INTRAMUSCULAR
  Filled 2020-01-07: qty 1

## 2020-01-07 MED ORDER — QUETIAPINE FUMARATE 25 MG PO TABS
12.5000 mg | ORAL_TABLET | Freq: Two times a day (BID) | ORAL | Status: DC
  Administered 2020-01-07 – 2020-01-09 (×5): 12.5 mg via ORAL
  Filled 2020-01-07 (×5): qty 1

## 2020-01-07 MED ORDER — PIPERACILLIN-TAZOBACTAM 3.375 G IVPB
3.3750 g | Freq: Three times a day (TID) | INTRAVENOUS | Status: DC
  Administered 2020-01-07 – 2020-01-08 (×2): 3.375 g via INTRAVENOUS
  Filled 2020-01-07 (×3): qty 50

## 2020-01-07 MED ORDER — PIPERACILLIN-TAZOBACTAM 3.375 G IVPB 30 MIN
3.3750 g | Freq: Once | INTRAVENOUS | Status: AC
  Administered 2020-01-07: 17:00:00 3.375 g via INTRAVENOUS
  Filled 2020-01-07: qty 50

## 2020-01-07 MED ORDER — ONDANSETRON HCL 4 MG PO TABS: 4.0000 mg | ORAL_TABLET | Freq: Four times a day (QID) | ORAL | Status: DC | PRN

## 2020-01-07 MED ORDER — CLOPIDOGREL BISULFATE 75 MG PO TABS
75.0000 mg | ORAL_TABLET | Freq: Every day | ORAL | Status: DC
  Administered 2020-01-08 – 2020-01-11 (×4): 75 mg via ORAL
  Filled 2020-01-07 (×4): qty 1

## 2020-01-07 MED ORDER — ASPIRIN EC 81 MG PO TBEC
81.0000 mg | DELAYED_RELEASE_TABLET | Freq: Every evening | ORAL | Status: DC
  Administered 2020-01-08 – 2020-01-10 (×3): 81 mg via ORAL
  Filled 2020-01-07 (×4): qty 1

## 2020-01-07 MED ORDER — LACTATED RINGERS IV BOLUS
1000.0000 mL | Freq: Once | INTRAVENOUS | Status: AC
  Administered 2020-01-07: 15:00:00 1000 mL via INTRAVENOUS

## 2020-01-07 MED ORDER — ATORVASTATIN CALCIUM 20 MG PO TABS
40.0000 mg | ORAL_TABLET | Freq: Every day | ORAL | Status: DC
  Administered 2020-01-08 – 2020-01-10 (×3): 40 mg via ORAL
  Filled 2020-01-07 (×4): qty 2

## 2020-01-07 MED ORDER — RISPERIDONE 0.5 MG PO TABS
0.5000 mg | ORAL_TABLET | Freq: Three times a day (TID) | ORAL | Status: DC
  Administered 2020-01-08 – 2020-01-11 (×10): 0.5 mg via ORAL
  Filled 2020-01-07 (×14): qty 1

## 2020-01-07 MED ORDER — METOPROLOL SUCCINATE ER 50 MG PO TB24
50.0000 mg | ORAL_TABLET | Freq: Every evening | ORAL | Status: DC
  Administered 2020-01-08 – 2020-01-10 (×3): 50 mg via ORAL
  Filled 2020-01-07 (×3): qty 1

## 2020-01-07 MED ORDER — ENOXAPARIN SODIUM 40 MG/0.4ML ~~LOC~~ SOLN
40.0000 mg | SUBCUTANEOUS | Status: DC
  Administered 2020-01-07 – 2020-01-10 (×3): 40 mg via SUBCUTANEOUS
  Filled 2020-01-07 (×4): qty 0.4

## 2020-01-07 MED ORDER — HALOPERIDOL LACTATE 5 MG/ML IJ SOLN
1.0000 mg | Freq: Four times a day (QID) | INTRAMUSCULAR | Status: DC | PRN
  Administered 2020-01-09: 16:00:00 1 mg via INTRAVENOUS
  Filled 2020-01-07: qty 1

## 2020-01-07 MED ORDER — ACETAMINOPHEN 650 MG RE SUPP
650.0000 mg | Freq: Four times a day (QID) | RECTAL | Status: DC | PRN
  Administered 2020-01-07: 650 mg via RECTAL
  Filled 2020-01-07: qty 1

## 2020-01-07 MED ORDER — VANCOMYCIN HCL IN DEXTROSE 1-5 GM/200ML-% IV SOLN
1000.0000 mg | Freq: Once | INTRAVENOUS | Status: DC
  Filled 2020-01-07: qty 200

## 2020-01-07 MED ORDER — AMLODIPINE BESYLATE 10 MG PO TABS
10.0000 mg | ORAL_TABLET | Freq: Every day | ORAL | Status: DC
  Administered 2020-01-08 – 2020-01-11 (×4): 10 mg via ORAL
  Filled 2020-01-07 (×4): qty 1

## 2020-01-07 MED ORDER — TRAZODONE HCL 50 MG PO TABS
50.0000 mg | ORAL_TABLET | Freq: Every day | ORAL | Status: DC
  Administered 2020-01-07 – 2020-01-10 (×3): 50 mg via ORAL
  Filled 2020-01-07 (×4): qty 1

## 2020-01-07 MED ORDER — ACETAMINOPHEN 325 MG PO TABS: 650.0000 mg | ORAL_TABLET | Freq: Four times a day (QID) | ORAL | Status: DC | PRN

## 2020-01-07 MED ORDER — ONDANSETRON HCL 4 MG/2ML IJ SOLN: 4.0000 mg | Freq: Four times a day (QID) | INTRAMUSCULAR | Status: DC | PRN

## 2020-01-07 MED ORDER — VANCOMYCIN HCL 1500 MG/300ML IV SOLN
1500.0000 mg | INTRAVENOUS | Status: DC
  Administered 2020-01-07: 18:00:00 1500 mg via INTRAVENOUS
  Filled 2020-01-07 (×2): qty 300

## 2020-01-07 NOTE — ED Provider Notes (Addendum)
  Patient with cellulitis in the right foot particularly over the lateral aspect extends up his leg suggesting lymphangitis.  I have ordered broad-spectrum antibiotics.  I will discuss with the hospitalist for admission.   Emily Filbert, MD 01/07/20 1550    Emily Filbert, MD 01/07/20 1550

## 2020-01-07 NOTE — ED Notes (Signed)
Pt tx to CT.

## 2020-01-07 NOTE — ED Notes (Signed)
Provided pts wife w/ update.

## 2020-01-07 NOTE — ED Triage Notes (Signed)
Patient presents to the ED via EMS from [facility].  Per EMS, staff reported that patient had a fever last night of 101 and they have been giving tylenol q 4 hours, but this am, patient was refusing tylenol.  When this RN asked patient how he felt he stated, "I would rather not say."  EMS reports a foul urine smell and staff states patient has been urinated more frequently than normal.  Staff performed a rapid covid test on patient and patient tested negative.

## 2020-01-07 NOTE — H&P (Signed)
[facility] [place] NAME: Glenn Barker    MR#:  [id number]  DATE OF BIRTH:  05-06-1933  DATE OF ADMISSION:  01/07/2020  PRIMARY CARE PHYSICIAN: [facility]   REQUESTING/REFERRING PHYSICIAN: Dr Cephas Darby  CHIEF COMPLAINT:   Chief Complaint  Patient presents with  . Fever    HISTORY OF PRESENT ILLNESS:  Glenn Barker  is a 86 y.o. male sent in from [facility] for fever and altered mental status.  Patient was pulling out the pulse ox when I was in the room and was agitated and received some medications.  He answers a few questions but is not the best historian.  History of dementia.  In the ER he was agitated and had a fever.  He has some redness on his right lower extremity.  He had an elevated white count.  Hospitalist services were contacted for further evaluation.  PAST MEDICAL HISTORY:   Past Medical History:  Diagnosis Date  . Coronary artery disease   . Dementia ([place])   . Hypertension     PAST SURGICAL HISTORY:   Past Surgical History:  Procedure Laterality Date  . NO PAST SURGERIES      SOCIAL HISTORY:   Social History   Tobacco Use  . Smoking status: Unknown If Ever Smoked  . Smokeless tobacco: Never Used  Substance Use Topics  . Alcohol use: Never    FAMILY HISTORY:   Family History  Problem Relation Age of Onset  . Hypertension Mother     DRUG ALLERGIES:   Allergies  Allergen Reactions  . Gabapentin   . Pregabalin     REVIEW OF SYSTEMS:  Unable to fully answer questions secondary to agitation and dementia.  CONSTITUTIONAL: Positive for fever and chills RESPIRATORY: No shortness of breath CARDIOVASCULAR: No chest pain.  GASTROINTESTINAL: Some abdominal pain.   MEDICATIONS AT HOME:   Prior to Admission medications   Medication Sig Start Date End Date Taking? Authorizing Provider  acetaminophen (TYLENOL) 500 MG tablet Take 1,000 mg by mouth 3 (three) times daily.     [provider]  amLODipine (NORVASC) 10 MG tablet Take 10 mg by mouth daily.    [provider]  aspirin EC 81 MG tablet Take 81 mg by mouth every evening.    [provider]  atorvastatin (LIPITOR) 40 MG tablet Take 40 mg by mouth daily.    [provider]  Camphor-Eucalyptus-Menthol (ICY HOT NO-MESS VAPOR GEL EX) Apply 1 application topically 3 (three) times daily.    [provider]  cholecalciferol (VITAMIN D3) 25 MCG (1000 UT) tablet Take 2,000 Units by mouth daily.    [provider]  clopidogrel (PLAVIX) 75 MG tablet Take 75 mg by mouth daily.    [provider]  lisinopril (ZESTRIL) 20 MG tablet Take 20 mg by mouth daily.    [provider]  metoprolol succinate (TOPROL-XL) 50 MG 24 hr tablet Take 50 mg by mouth every evening.    [provider]  potassium chloride SA (K-DUR) 20 MEQ tablet Take 20 mEq by mouth 2 (two) times daily.    [provider]  QUEtiapine (SEROQUEL) 25 MG tablet Take 12.5 mg by mouth 2 (two) times daily.    [provider]  traMADol (ULTRAM) 50 MG tablet Take 50 mg by mouth 2 (two) times daily.    [provider]  venlafaxine (EFFEXOR) 75 MG tablet Take 75 mg by mouth 2 (two) times  daily.    [provider]  vitamin B-12 (CYANOCOBALAMIN) 1000 MCG tablet Take 1,000 mcg by mouth daily.    [provider]   Medication reconciliation still undergoing  VITAL SIGNS:  Blood pressure (!) 102/56, pulse (!) 58, temperature (!) 102.8 F (39.3 C), temperature source Axillary, resp. rate 18, height 5' 10.5" (1.791 m), weight 77.1 kg, SpO2 97 %.  PHYSICAL EXAMINATION:  GENERAL:  86 y.o.-year-old patient lying in the bed with agitation.  EYES: Pupils equal, round, reactive to light and accommodation. No scleral icterus. Extraocular muscles intact.  HEENT: Head atraumatic, normocephalic. Oropharynx and nasopharynx clear.  NECK:  Supple, no jugular  venous distention. No thyroid enlargement, no tenderness.  LUNGS: Normal breath sounds bilaterally, no wheezing, rales,rhonchi or crepitation. No use of accessory muscles of respiration.  CARDIOVASCULAR: S1, S2 normal. No murmurs, rubs, or gallops.  ABDOMEN: Soft, right upper quadrant tenderness, nondistended. Bowel sounds present. No organomegaly or mass.  EXTREMITIES: No pedal edema, cyanosis, or clubbing.  NEUROLOGIC: Patient moves all of his extremities PSYCHIATRIC: The patient is alert and answers a few questions. SKIN: Redness right lower extremity  LABORATORY PANEL:   CBC Recent Labs  Lab 01/07/20 1110  WBC 12.2*  HGB 12.5*  HCT 37.7*  PLT 227   ------------------------------------------------------------------------------------------------------------------  Chemistries  Recent Labs  Lab 01/07/20 1110  NA 137  K 4.1  CL 100  CO2 30  GLUCOSE 140*  BUN 18  CREATININE 0.99  CALCIUM 9.4  AST 21  ALT 12  ALKPHOS 50  BILITOT 0.6   ------------------------------------------------------------------------------------------------------------------   RADIOLOGY:  DG Chest 2 View  Result Date: 01/07/2020 CLINICAL DATA:  Fever. EXAM: CHEST - 2 VIEW COMPARISON:  09/12/2019 FINDINGS: Lungs are hyperexpanded. The lungs are clear without focal pneumonia, edema, pneumothorax or pleural effusion. Cardiopericardial silhouette is at upper limits of normal for size. Bones are diffusely demineralized. IMPRESSION: Hyperexpansion without acute cardiopulmonary findings. Electronically Signed   By: Kennith Center M.D.   On: 01/07/2020 10:45   DG Foot Complete Right  Result Date: 01/07/2020 CLINICAL DATA:  Foot pain and swelling. Lateral foot redness. EXAM: RIGHT FOOT COMPLETE - 3+ VIEW COMPARISON:  None. FINDINGS: Soft tissue swelling is noted along the lateral and dorsal aspects of the foot. No acute fracture, dislocation, osseous erosion, radiopaque foreign body, or soft tissue  emphysema is identified. IMPRESSION: Soft tissue swelling without acute osseous abnormality. Electronically Signed   By: Sebastian Ache M.D.   On: 01/07/2020 16:28    EKG:   Normal sinus rhythm 56 bpm  IMPRESSION AND PLAN:   1.  Clinical sepsis with acute metabolic encephalopathy, fever, leukocytosis, cellulitis of the right lower extremity.  Covid test pending.  Patient having some abdominal pain so we will get a right upper quadrant ultrasound.  Empiric antibiotics with vancomycin and Zosyn for now. 2.  Acute metabolic encephalopathy with history of dementia.  Likely underlying delirium.  Continue Seroquel. 3.  Essential hypertension on Norvasc. 4.  History of CAD looks like on aspirin Plavix and atorvastatin and metoprolol.    All the records are reviewed and case discussed with ED provider. Management plans discussed with the patient, and he is in agreement.  I tried to call the patient's wife but phone number did not go through.  I tried to call the patient's daughter and left a message  CODE STATUS: Full code for now  TOTAL TIME TAKING CARE OF THIS PATIENT: 50 minutes.    Alford Highland M.D on 01/07/2020  at 4:38 PM  Between 7am to 6pm - Pager - [phone]  After 6pm call admission pager [phone]  [facility]  CC: Primary care physician; [facility]

## 2020-01-07 NOTE — ED Notes (Signed)
Peripheral IV placed in Lt AC and pt removed while collecting labs. Pt became agitated with swabbing, I/O catheter and collecting labs. ED tech Mardelle Matte assisted this RN and is assigned to supervise.

## 2020-01-07 NOTE — Consult Note (Signed)
Pharmacy Antibiotic Note  Glenn Barker is a 86 y.o. male admitted on 01/07/2020 with sepsis.  Pharmacy has been consulted for Vancomycin/Zosyn dosing.  Plan: Zosyn 3.375g IV q8h (4 hour infusion).\  Vancomycin 1500 mg IV Q 24 hrs. Goal AUC 400-550. Expected AUC: 494 SCr used: 0.99   Height: 5' 10.5" (179.1 cm) Weight: 170 lb (77.1 kg) IBW/kg (Calculated) : 74.15  Temp (24hrs), Avg:101.7 F (38.7 C), Min:99.1 F (37.3 C), Max:103.1 F (39.5 C)  Recent Labs  Lab 01/07/20 1110 01/07/20 1116  WBC 12.2*  --   CREATININE 0.99  --   LATICACIDVEN  --  2.1*    Estimated Creatinine Clearance: 58.3 mL/min (by C-G formula based on SCr of 0.99 mg/dL).    Allergies  Allergen Reactions  . Gabapentin   . Pregabalin     Antimicrobials this admission: Vancomycin 1/25 >> Zosyn 1/25 >>  Dose adjustments this admission: none  Microbiology results: Respiratory panel pending BCx 1/25 pending  Thank you for allowing pharmacy to be a part of this patient's care.  Albina Billet, PharmD, BCPS Clinical Pharmacist 01/07/2020 5:51 PM

## 2020-01-07 NOTE — ED Provider Notes (Signed)
[facility] Emergency Department Provider Note   ____________________________________________   First MD Initiated Contact with Patient 01/07/20 1406     (approximate)  I have reviewed the triage vital signs and the nursing notes.   HISTORY  Chief Complaint Fever    HPI Glenn Barker is a 86 y.o. male with past medical history of dementia, hypertension, and CAD who presents to the ED for fever.  History is limited secondary to patient's dementia and when asked questions he responds only by mumbling.  Staff at [facility] states patient was noted to have fever starting around 4:00 yesterday afternoon.  They report temperatures as high as 101 and consistently getting temperatures of 100.1 and 100.3 earlier this morning.  He received scheduled Tylenol regularly for pain, but refused all of his medications this morning and "seemed off" per staff.  He is reportedly confused but verbally interactive at baseline.  Staff state that he has not complained of any pain or been coughing recently.        Past Medical History:  Diagnosis Date  . Coronary artery disease   . Dementia ([facility])   . Hypertension     There are no problems to display for this patient.   No past surgical history on file.  Prior to Admission medications   Medication Sig Start Date End Date Taking? Authorizing Provider  acetaminophen (TYLENOL) 500 MG tablet Take 1,000 mg by mouth 3 (three) times daily.    [provider]  amLODipine (NORVASC) 10 MG tablet Take 10 mg by mouth daily.    [provider]  aspirin EC 81 MG tablet Take 81 mg by mouth every evening.    [provider]  atorvastatin (LIPITOR) 40 MG tablet Take 40 mg by mouth daily.    [provider]  Camphor-Eucalyptus-Menthol (ICY HOT NO-MESS VAPOR GEL EX) Apply 1 application topically 3 (three) times daily.    [provider]  cholecalciferol (VITAMIN D3) 25 MCG (1000 UT) tablet Take  2,000 Units by mouth daily.    [provider]  clopidogrel (PLAVIX) 75 MG tablet Take 75 mg by mouth daily.    [provider]  lisinopril (ZESTRIL) 20 MG tablet Take 20 mg by mouth daily.    [provider]  metoprolol succinate (TOPROL-XL) 50 MG 24 hr tablet Take 50 mg by mouth every evening.    [provider]  potassium chloride SA (K-DUR) 20 MEQ tablet Take 20 mEq by mouth 2 (two) times daily.    [provider]  QUEtiapine (SEROQUEL) 25 MG tablet Take 12.5 mg by mouth 2 (two) times daily.    [provider]  traMADol (ULTRAM) 50 MG tablet Take 50 mg by mouth 2 (two) times daily.    [provider]  venlafaxine (EFFEXOR) 75 MG tablet Take 75 mg by mouth 2 (two) times daily.    [provider]  vitamin B-12 (CYANOCOBALAMIN) 1000 MCG tablet Take 1,000 mcg by mouth daily.    [provider]    Allergies Gabapentin and Pregabalin  No family history on file.  Social History Social History   Tobacco Use  . Smoking status: Unknown If Ever Smoked  . Smokeless tobacco: Never Used  Substance Use Topics  . Alcohol use: Never  . Drug use: Never    Review of Systems Unable to obtain secondary to dementia  ____________________________________________   PHYSICAL EXAM:  VITAL SIGNS: ED Triage Vitals  Enc Vitals Group  BP 01/07/20 1053 (!) 102/56     Pulse Rate 01/07/20 1053 (!) 58     Resp 01/07/20 1053 18     Temp 01/07/20 1053 99.1 F (37.3 C)     Temp Source 01/07/20 1053 Oral     SpO2 01/07/20 1053 97 %     Weight 01/07/20 1025 170 lb (77.1 kg)     Height 01/07/20 1025 5' 10.5" (1.791 m)     Head Circumference --      Peak Flow --      Pain Score --      Pain Loc --      Pain Edu? --      Excl. in [place]? --     Constitutional: Alert with mumbling speech. Eyes: Conjunctivae are normal. Head: Atraumatic. Nose: No congestion/rhinnorhea. Mouth/Throat: Mucous membranes are moist. Neck:  Normal ROM Cardiovascular: Normal rate, regular rhythm. Grossly normal heart sounds. Respiratory: Normal respiratory effort.  No retractions. Lungs CTAB. Gastrointestinal: Soft and diffusely tender to palpation with no rebound or guarding. Genitourinary: deferred Musculoskeletal: No lower extremity tenderness nor edema. Neurologic:  Normal speech and language. No gross focal neurologic deficits are appreciated. Skin:  Skin is warm, dry and intact. No rash noted. Psychiatric: Mood and affect are normal. Speech and behavior are normal.  ____________________________________________   LABS (all labs ordered are listed, but only abnormal results are displayed)  Labs Reviewed  LACTIC ACID, PLASMA - Abnormal; Notable for the following components:      Result Value   Lactic Acid, Venous 2.1 (*)    All other components within normal limits  COMPREHENSIVE METABOLIC PANEL - Abnormal; Notable for the following components:   Glucose, Bld 140 (*)    All other components within normal limits  CBC WITH DIFFERENTIAL/PLATELET - Abnormal; Notable for the following components:   WBC 12.2 (*)    RBC 3.77 (*)    Hemoglobin 12.5 (*)    HCT 37.7 (*)    Neutro Abs 11.0 (*)    Lymphs Abs 0.5 (*)    All other components within normal limits  RESPIRATORY PANEL BY RT PCR (FLU A&B, COVID)  CULTURE, BLOOD (ROUTINE X 2)  CULTURE, BLOOD (ROUTINE X 2)  LIPASE, BLOOD  LACTIC ACID, PLASMA  URINALYSIS, COMPLETE (UACMP) WITH MICROSCOPIC   ____________________________________________    PROCEDURES  Procedure(s) performed (including Critical Care):  Procedures   ____________________________________________   INITIAL IMPRESSION / ASSESSMENT AND PLAN / ED COURSE       86 year old male with history of dementia presents to the ED for fevers noted since last night, was also noted to be acting "off" earlier today by staff at [facility].  Hemodynamics are stable upon arrival and patient is afebrile here  in the ED, although he does receive scheduled Tylenol.  Will check blood cultures, although vital signs are not consistent with sepsis.  No obvious source of infection thus far as his chest x-ray is unremarkable, although staff had reported foul-smelling urine and his UA is still pending.  We will hydrate with IV fluids, obtain cath UA, CT abdomen, and perform COVID-19 testing to evaluate for source of infection.  Given questionable change in mental status, will also screen CT head.  Patient turned over to oncoming provider pending results of infectious work-up.     ____________________________________________   FINAL CLINICAL IMPRESSION(S) / ED DIAGNOSES  Final diagnoses:  Fever, unspecified fever cause     ED Discharge Orders    None  Note:  This document was prepared using Dragon voice recognition software and may include unintentional dictation errors.   Chesley Noon, MD 01/07/20 [phone]

## 2020-01-08 ENCOUNTER — Inpatient Hospital Stay: Payer: Medicare Other

## 2020-01-08 DIAGNOSIS — E872 Acidosis, unspecified: Secondary | ICD-10-CM

## 2020-01-08 LAB — CBC
HCT: 36.6 % — ABNORMAL LOW (ref 39.0–52.0)
Hemoglobin: 11.6 g/dL — ABNORMAL LOW (ref 13.0–17.0)
MCH: 32.4 pg (ref 26.0–34.0)
MCHC: 31.7 g/dL (ref 30.0–36.0)
MCV: 102.2 fL — ABNORMAL HIGH (ref 80.0–100.0)
Platelets: 184 10*3/uL (ref 150–400)
RBC: 3.58 MIL/uL — ABNORMAL LOW (ref 4.22–5.81)
RDW: 13.2 % (ref 11.5–15.5)
WBC: 14.6 10*3/uL — ABNORMAL HIGH (ref 4.0–10.5)
nRBC: 0 % (ref 0.0–0.2)

## 2020-01-08 LAB — MRSA PCR SCREENING: MRSA by PCR: NEGATIVE

## 2020-01-08 LAB — BASIC METABOLIC PANEL
Anion gap: 9 (ref 5–15)
BUN: 18 mg/dL (ref 8–23)
CO2: 30 mmol/L (ref 22–32)
Calcium: 9 mg/dL (ref 8.9–10.3)
Chloride: 101 mmol/L (ref 98–111)
Creatinine, Ser: 0.87 mg/dL (ref 0.61–1.24)
GFR calc Af Amer: >60 mL/min (ref >60)
GFR calc non Af Amer: >60 mL/min (ref >60)
Glucose, Bld: 119 mg/dL — ABNORMAL HIGH (ref 70–99)
Potassium: 3.2 mmol/L — ABNORMAL LOW (ref 3.5–5.1)
Sodium: 140 mmol/L (ref 135–145)

## 2020-01-08 MED ORDER — POTASSIUM CHLORIDE IN NACL 20-0.9 MEQ/L-% IV SOLN
INTRAVENOUS | Status: DC
  Filled 2020-01-08 (×4): qty 1000

## 2020-01-08 MED ORDER — POTASSIUM CHLORIDE 20 MEQ PO PACK
40.0000 meq | PACK | Freq: Once | ORAL | Status: AC
  Administered 2020-01-08: 40 meq via ORAL
  Filled 2020-01-08: qty 2

## 2020-01-08 MED ORDER — CEFAZOLIN SODIUM-DEXTROSE 2-4 GM/100ML-% IV SOLN
2.0000 g | Freq: Three times a day (TID) | INTRAVENOUS | Status: DC
  Administered 2020-01-08 – 2020-01-11 (×9): 2 g via INTRAVENOUS
  Filled 2020-01-08 (×11): qty 100

## 2020-01-08 NOTE — Progress Notes (Signed)
At 0328 sent Secure chat to NP Webb Silversmith notified her patient admitted with yellow DNR paper. At 0358 NP replied " Oh no we need to change that". No change has been done to the code status at this time, patient is still full code via Epic. Will pass this information on in report.

## 2020-01-08 NOTE — Plan of Care (Signed)
  Problem: Fluid Volume: Goal: Hemodynamic stability will improve Outcome: Progressing   Problem: Clinical Measurements: Goal: Diagnostic test results will improve Outcome: Progressing Goal: Signs and symptoms of infection will decrease Outcome: Progressing   Problem: Clinical Measurements: Goal: Ability to maintain clinical measurements within normal limits will improve Outcome: Progressing Goal: Will remain free from infection Outcome: Progressing Goal: Diagnostic test results will improve Outcome: Progressing   Problem: Activity: Goal: Risk for activity intolerance will decrease Outcome: Progressing   Problem: Coping: Goal: Level of anxiety will decrease Outcome: Progressing   Problem: Safety: Goal: Ability to remain free from injury will improve Outcome: Progressing

## 2020-01-08 NOTE — Progress Notes (Addendum)
Patient ID: Glenn Barker, male   DOB: 11-Aug-1933, 86 y.o.   MRN: [id number] [facility] PROGRESS NOTE  Adriano Bischof GLO:[id number] DOB: 1933/09/03 DOA: 01/07/2020 PCP: [facility]  HPI/Subjective: Patient was admitted last night with clinical sepsis.  Patient was agitated yesterday but resting comfortably now.  As per the nurse was able to take a few medications.  Wife states she visited but was not able to do much.  When I saw him I gave him a sternal rub he barely opened his eyes.  Objective: Vitals:   01/08/20 0939 01/08/20 1158  BP: (!) 145/54 129/64  Pulse: (!) 52 61  Resp:  17  Temp:  99.4 F (37.4 C)  SpO2: 97% 95%    Intake/Output Summary (Last 24 hours) at 01/08/2020 1523 Last data filed at 01/08/2020 1300 Gross per 24 hour  Intake 850 ml  Output 1100 ml  Net -250 ml   Filed Weights   01/07/20 1025 01/07/20 2206  Weight: 77.1 kg 66.1 kg    ROS: Review of Systems  Unable to perform ROS: Acuity of condition   Exam: Physical Exam  Constitutional: He appears lethargic.  HENT:  Nose: No mucosal edema.  Unable to look into mouth today  Eyes: Conjunctivae and lids are normal.  Neck: Carotid bruit is present.  Cardiovascular: Regular rhythm, S1 normal, S2 normal and normal heart sounds.  Respiratory: He has no decreased breath sounds. He has no wheezes. He has no rhonchi. He has no rales.  GI: Soft. Bowel sounds are normal. There is no abdominal tenderness.  Musculoskeletal:     Right ankle: No swelling.     Left ankle: No swelling.  Neurological: He appears lethargic.  With sternal rub barely opens eyes.  Moved his left arm on his own.  Skin: Skin is warm. Nails show no clubbing.  Right foot erythema a little less than yesterday.  Psychiatric:  Barely opens eyes to sternal rub.      Data Reviewed: Basic Metabolic Panel: Recent Labs  Lab 01/07/20 1110 01/07/20 1824 01/08/20 0401  NA 137  --  140  K 4.1  --  3.2*  CL 100  --  101   CO2 30  --  30  GLUCOSE 140*  --  119*  BUN 18  --  18  CREATININE 0.99 0.86 0.87  CALCIUM 9.4  --  9.0   Liver Function Tests: Recent Labs  Lab 01/07/20 1110  AST 21  ALT 12  ALKPHOS 50  BILITOT 0.6  PROT 6.9  ALBUMIN 3.7   Recent Labs  Lab 01/07/20 1110  LIPASE 15   CBC: Recent Labs  Lab 01/07/20 1110 01/08/20 0401  WBC 12.2* 14.6*  NEUTROABS 11.0*  --   HGB 12.5* 11.6*  HCT 37.7* 36.6*  MCV 100.0 102.2*  PLT 227 184     Recent Results (from the past 240 hour(s))  Respiratory Panel by RT PCR (Flu A&B, Covid) -     Status: None   Collection Time: 01/07/20  2:25 PM  Result Value Ref Range Status   SARS Coronavirus 2 by RT PCR NEGATIVE NEGATIVE Final    Comment: (NOTE) SARS-CoV-2 target nucleic acids are NOT DETECTED. The SARS-CoV-2 RNA is generally detectable in upper respiratoy specimens during the acute phase of infection. The lowest concentration of SARS-CoV-2 viral copies this assay can detect is 131 copies/mL. A negative result does not preclude SARS-Cov-2 infection and should not be used as the sole basis for treatment or  other patient management decisions. A negative result may occur with  improper specimen collection/handling, submission of specimen other than nasopharyngeal swab, presence of viral mutation(s) within the areas targeted by this assay, and inadequate number of viral copies (<131 copies/mL). A negative result must be combined with clinical observations, patient history, and epidemiological information. The expected result is Negative. Fact Sheet for Patients:  https://www.moore.com/ Fact Sheet for Healthcare Providers:  https://www.young.biz/ This test is not yet ap proved or cleared by the [place] FDA and  has been authorized for detection and/or diagnosis of SARS-CoV-2 by FDA under an Emergency Use Authorization (EUA). This EUA will remain  in effect (meaning this test can be used) for  the duration of the COVID-19 declaration under Section 564(b)(1) of the Act, 21 U.S.C. section 360bbb-3(b)(1), unless the authorization is terminated or revoked sooner.    Influenza A by PCR NEGATIVE NEGATIVE Final   Influenza B by PCR NEGATIVE NEGATIVE Final    Comment: (NOTE) The Xpert Xpress SARS-CoV-2/FLU/RSV assay is intended as an aid in  the diagnosis of influenza from Nasopharyngeal swab specimens and  should not be used as a sole basis for treatment. Nasal washings and  aspirates are unacceptable for Xpert Xpress SARS-CoV-2/FLU/RSV  testing. Fact Sheet for Patients: https://www.moore.com/ Fact Sheet for Healthcare Providers: https://www.young.biz/ This test is not yet approved or cleared by the [place] FDA and  has been authorized for detection and/or diagnosis of SARS-CoV-2 by  FDA under an Emergency Use Authorization (EUA). This EUA will remain  in effect (meaning this test can be used) for the duration of the  Covid-19 declaration under Section 564(b)(1) of the Act, 21  U.S.C. section 360bbb-3(b)(1), unless the authorization is  terminated or revoked. Performed at [facility], [address] [place], [place] [address]   Culture, blood (routine x 2)     Status: None (Preliminary result)   Collection Time: 01/07/20  2:25 PM   Specimen: BLOOD  Result Value Ref Range Status   Specimen Description   Final    BLOOD Blood Culture results may not be optimal due to an inadequate volume of blood received in culture bottles   Special Requests   Final    BOTTLES DRAWN AEROBIC AND ANAEROBIC BLOOD RIGHT ARM   Culture   Final    NO GROWTH < 24 HOURS Performed at [facility], [address], [place], [place] [address]    Report Status PENDING  Incomplete  Culture, blood (routine x 2)     Status: None (Preliminary result)   Collection Time: 01/07/20 11:45 PM   Specimen: BLOOD  Result Value Ref Range Status    Specimen Description BLOOD BLOOD LEFT WRIST  Final   Special Requests   Final    BOTTLES DRAWN AEROBIC AND ANAEROBIC Blood Culture adequate volume   Culture   Final    NO GROWTH < 12 HOURS Performed at [facility], [address], [place], [place] [address]    Report Status PENDING  Incomplete  MRSA PCR Screening     Status: None   Collection Time: 01/08/20  5:27 AM   Specimen: Nasopharyngeal  Result Value Ref Range Status   MRSA by PCR NEGATIVE NEGATIVE Final    Comment:        The GeneXpert MRSA Assay (FDA approved for NASAL specimens only), is one component of a comprehensive MRSA colonization surveillance program. It is not intended to diagnose MRSA infection nor to guide or monitor treatment for  MRSA infections. Performed at [facility], [address] [place], [place] [address]      Studies: DG Chest 2 View  Result Date: 01/07/2020 CLINICAL DATA:  Fever. EXAM: CHEST - 2 VIEW COMPARISON:  09/12/2019 FINDINGS: Lungs are hyperexpanded. The lungs are clear without focal pneumonia, edema, pneumothorax or pleural effusion. Cardiopericardial silhouette is at upper limits of normal for size. Bones are diffusely demineralized. IMPRESSION: Hyperexpansion without acute cardiopulmonary findings. Electronically Signed   By: Kennith Center M.D.   On: 01/07/2020 10:45   CT Head Wo Contrast  Result Date: 01/07/2020 CLINICAL DATA:  86 year old male with altered mental status. EXAM: CT HEAD WITHOUT CONTRAST TECHNIQUE: Contiguous axial images were obtained from the base of the skull through the vertex without intravenous contrast. COMPARISON:  08/05/2019 FINDINGS: Brain: No evidence of acute infarction, hemorrhage, hydrocephalus, extra-axial collection or mass lesion/mass effect. Atrophy and mild chronic small-vessel white matter ischemic changes. Vascular: Carotid atherosclerotic calcifications noted. Skull: Normal. Negative for fracture or focal lesion. Sinuses/Orbits: No  acute finding. Other: None. IMPRESSION: 1. No evidence of acute intracranial abnormality. 2. Atrophy and chronic small-vessel white matter ischemic changes. Electronically Signed   By: Harmon Pier M.D.   On: 01/07/2020 17:13   CT Abdomen Pelvis W Contrast  Result Date: 01/07/2020 CLINICAL DATA:  86 year old male with fever and abdominal pain. EXAM: CT ABDOMEN AND PELVIS WITH CONTRAST TECHNIQUE: Multidetector CT imaging of the abdomen and pelvis was performed using the standard protocol following bolus administration of intravenous contrast. CONTRAST:  OMNIPAQUE IOHEXOL 300 MG/ML  SOLN COMPARISON:  None. FINDINGS: Lower chest: LEFT basilar scarring/atelectasis noted. Hepatobiliary: The liver and gallbladder are unremarkable. No biliary dilatation. Pancreas: Unremarkable Spleen: Unremarkable Adrenals/Urinary Tract: 2 punctate nonobstructing RIGHT LOWER pole renal calculi are identified. A RIGHT LOWER pole renal cyst is present. No obstructing urinary calculi noted. Mild circumferential bladder wall thickening is present. The adrenal glands are unremarkable. Stomach/Bowel: Stomach is within normal limits. No evidence of bowel wall thickening, distention, or inflammatory changes. Colonic diverticulosis noted without evidence of acute diverticulitis. Vascular/Lymphatic: Aortic atherosclerosis. No enlarged abdominal or pelvic lymph nodes. Reproductive: Mild prostate enlargement Other: No ascites, focal collection or pneumoperitoneum Musculoskeletal: No acute or significant osseous findings. Degenerative changes within the lumbar spine identified IMPRESSION: 1. No evidence of acute abnormality. 2. Colonic diverticulosis without evidence of acute diverticulitis 3. Nonobstructing RIGHT LOWER pole renal calculi. 4. Mild circumferential bladder wall thickening, favor mild chronic bilateral outlet obstruction over cystitis. Correlate clinically. 5.  Aortic Atherosclerosis (ICD10-I70.0). Electronically Signed   By:  Harmon Pier M.D.   On: 01/07/2020 17:05   DG Foot Complete Right  Result Date: 01/07/2020 CLINICAL DATA:  Foot pain and swelling. Lateral foot redness. EXAM: RIGHT FOOT COMPLETE - 3+ VIEW COMPARISON:  None. FINDINGS: Soft tissue swelling is noted along the lateral and dorsal aspects of the foot. No acute fracture, dislocation, osseous erosion, radiopaque foreign body, or soft tissue emphysema is identified. IMPRESSION: Soft tissue swelling without acute osseous abnormality. Electronically Signed   By: Sebastian Ache M.D.   On: 01/07/2020 16:28   US Abdomen Limited RUQ  Result Date: 01/08/2020 CLINICAL DATA:  Abdominal pain. EXAM: ULTRASOUND ABDOMEN LIMITED RIGHT UPPER QUADRANT COMPARISON:  CT abdomen and pelvis 01/07/2020 FINDINGS: Gallbladder: No gallstones or wall thickening visualized. No sonographic Murphy sign noted by sonographer. Common bile duct: Diameter: 6 mm Liver: No focal lesion identified. Within normal limits in parenchymal echogenicity. Portal vein is patent on color Doppler imaging with normal direction  of blood flow towards the liver. Other: None. IMPRESSION: Unremarkable right upper quadrant ultrasound. Electronically Signed   By: Sebastian Ache M.D.   On: 01/08/2020 08:15    Scheduled Meds: . amLODipine  10 mg Oral Daily  . aspirin EC  81 mg Oral QPM  . atorvastatin  40 mg Oral q1800  . clopidogrel  75 mg Oral Daily  . enoxaparin (LOVENOX) injection  40 mg Subcutaneous Q24H  . metoprolol succinate  50 mg Oral QPM  . QUEtiapine  12.5 mg Oral BID  . risperiDONE  0.5 mg Oral TID  . traZODone  50 mg Oral QHS   Continuous Infusions: . 0.9 % NaCl with KCl 20 mEq / L    .  ceFAZolin (ANCEF) IV      Assessment/Plan:  1. Clinical sepsis with acute metabolic encephalopathy with fever, leukocytosis, lactic acidosis, cellulitis of the right lower extremity.  Covid test negative.  CT scan of the abdomen and right upper quadrant ultrasound of the abdomen negative for intra-abdominal  infection.  Chest x-ray negative for pneumonia.  Urine analysis negative.  Since I am likely treating cellulitis I will switch antibiotics over to Ancef at this time.  Gentle IV fluids secondary to altered mental status and lactic acidosis. 2. Acute metabolic encephalopathy with history of dementia.  Likely has an underlying delirium.  Continue Seroquel, risperidone and trazodone 3. Essential hypertension on Norvasc and metoprolol 4. History of CAD on aspirin, Plavix atorvastatin and metoprolol 5. Hypokalemia.  Replace potassium and IV fluids and some not sure if he was able to take potassium supplementation this morning.  Code Status:     Code Status Orders  (From admission, onward)         Start     Ordered   01/08/20 1145  Do not attempt resuscitation (DNR)  Continuous    Question Answer Comment  In the event of cardiac or respiratory ARREST Do not call a "code blue"   In the event of cardiac or respiratory ARREST Do not perform Intubation, CPR, defibrillation or ACLS   In the event of cardiac or respiratory ARREST Use medication by any route, position, wound care, and other measures to relive pain and suffering. May use oxygen, suction and manual treatment of airway obstruction as needed for comfort.   Comments nurse may pronounce      01/08/20 1145        Code Status History    Date Active Date Inactive Code Status Order ID Comments User Context   01/07/2020 1649 01/08/2020 1145 Full Code [id number]  Alford Highland, MD ED   Advance Care Planning Activity     Family Communication: Spoke with the patient's wife on the phone Disposition Plan: Patient's mental status will need to improve prior to any disposition.  Antibiotics:  Ancef  Time spent: 27 minutes  [employer]

## 2020-01-08 NOTE — Consult Note (Signed)
Pharmacy Antibiotic Note  Glenn Barker is a 86 y.o. male admitted on 01/07/2020 with sepsis/cellulitis. Pharmacy has been consulted for Cefazolin dosing. -transition fro Vanc/Zosyn to Cefazolin  Plan: Cefazolin 2 gm IV q8h   Height: 5\' 10"  (177.8 cm) Weight: 145 lb 12.8 oz (66.1 kg) IBW/kg (Calculated) : 73  Temp (24hrs), Avg:100.6 F (38.1 C), Min:98.9 F (37.2 C), Max:103.1 F (39.5 C)  Recent Labs  Lab 01/07/20 1110 01/07/20 1116 01/07/20 1824 01/08/20 0401  WBC 12.2*  --   --  14.6*  CREATININE 0.99  --  0.86 0.87  LATICACIDVEN  --  2.1* 2.1*  --     Estimated Creatinine Clearance: 59.1 mL/min (by C-G formula based on SCr of 0.87 mg/dL).    Allergies  Allergen Reactions  . Gabapentin   . Pregabalin     Antimicrobials this admission: Vancomycin 1/25 >> 1/26 Zosyn 1/25 >> 1/26 Cefazolin 1/26 >>  Dose adjustments this admission: none  Microbiology results: Respiratory panel pending BCx 1/25 pending  Thank you for allowing pharmacy to be a part of this patient's care.  2/25, PharmD Clinical Pharmacist 01/08/2020 11:56 AM

## 2020-01-09 DIAGNOSIS — L03115 Cellulitis of right lower limb: Secondary | ICD-10-CM

## 2020-01-09 DIAGNOSIS — F0391 Unspecified dementia with behavioral disturbance: Secondary | ICD-10-CM

## 2020-01-09 DIAGNOSIS — G9341 Metabolic encephalopathy: Secondary | ICD-10-CM | POA: Diagnosis present

## 2020-01-09 LAB — BASIC METABOLIC PANEL
Anion gap: 8 (ref 5–15)
BUN: 20 mg/dL (ref 8–23)
CO2: 30 mmol/L (ref 22–32)
Calcium: 8.8 mg/dL — ABNORMAL LOW (ref 8.9–10.3)
Chloride: 105 mmol/L (ref 98–111)
Creatinine, Ser: 0.96 mg/dL (ref 0.61–1.24)
GFR calc Af Amer: >60 mL/min (ref >60)
GFR calc non Af Amer: >60 mL/min (ref >60)
Glucose, Bld: 107 mg/dL — ABNORMAL HIGH (ref 70–99)
Potassium: 3.2 mmol/L — ABNORMAL LOW (ref 3.5–5.1)
Sodium: 143 mmol/L (ref 135–145)

## 2020-01-09 LAB — LACTIC ACID, PLASMA: Lactic Acid, Venous: 1.3 mmol/L (ref 0.5–1.9)

## 2020-01-09 LAB — MAGNESIUM: Magnesium: 2.1 mg/dL (ref 1.7–2.4)

## 2020-01-09 MED ORDER — POTASSIUM CHLORIDE 20 MEQ PO PACK
40.0000 meq | PACK | ORAL | Status: AC
  Administered 2020-01-09 (×2): 40 meq via ORAL
  Filled 2020-01-09 (×2): qty 2

## 2020-01-09 MED ORDER — HALOPERIDOL LACTATE 5 MG/ML IJ SOLN
2.0000 mg | INTRAMUSCULAR | Status: DC | PRN
  Administered 2020-01-10 (×2): 2 mg via INTRAVENOUS
  Filled 2020-01-09 (×3): qty 1

## 2020-01-09 NOTE — Progress Notes (Signed)
PROGRESS NOTE    Glenn Barker  ALP:[id number] DOB: 1933-07-16 DOA: 01/07/2020  PCP: [facility]    LOS - 2   Brief Narrative:  Patient admitted 01/07/20 with sepsis secondary to right lower extremity cellulitis after presenting from [facility] with fever and altered mental status.  Initially treated with broad spectrum antibiotics.  CT scan of the abdomen and right upper quadrant ultrasound of the abdomen negative for intra-abdominal infection.  Chest x-ray negative for pneumonia.  Urine analysis negative.  Right lower extremity cellulitis only source of infection identified.  Antibiotics de-escalated to Ancef.  Subjective 1/27: Patient seen this AM.  Reports feeling okay.  Says his right leg still hurts, painful to touch even in non-red areas.  Also reports tenderness palpating the left leg, so unlikely due to the cellulitis.  He gets tearful and upset for unknown reason, possibly baseline dementia.  No acute events or reports of agitation.  Assessment & Plan:   Principal Problem:   Cellulitis of right lower extremity Active Problems:   Sepsis (HCC)   Lactic acidosis   Acute metabolic encephalopathy   Dementia with behavioral disturbance (HCC)   Sepsis secondary to right lower extremity cellulitis Sepsis resolved - vitals stabilized.  Cellulitis appears to be improving. --Continue Ancef  Acute Metabolic Encephalopathy - likely delirium due to infection, superimposed on baseline dementia. Mentation improved, appears likely at his baseline.  No focal neurologic deficits.  Hypokalemia - replaced --monitor daily, replete as needed for K>4.0  Lactic Acidosis - secondary to sepsis.  Lactate 2.1 on admission.  Expect normalization with IV fluids. --repeat pending  Dementia  --continue Seroquel  History of CAD --continue ASA, Plavix, Lipitor, metoprolol  Essential Hypertension  --continue Norvasc, metoprolol   DVT prophylaxis: Lovenox   Code Status: DNR    Family Communication: none at bedside during encounter  Disposition Plan:  Expect return to SNF pending further improvement, estimate 1-2 more days IV antiobiotics and can transition to oral for discharge.   Consultants:   None  Procedures:   None  Antimicrobials:   Vanc/Zosyn 1/25 >>> discontinued  Ancef 01/08/20 >>>   Objective: Vitals:   01/09/20 0803 01/09/20 1000 01/09/20 1111 01/09/20 1209  BP: 131/74 (!) 154/72 (!) 154/72 125/74  Pulse: (!) 58 61  (!) 59  Resp: 16     Temp: 98 F (36.7 C)   (!) 97.4 F (36.3 C)  TempSrc:    Oral  SpO2: 99%   96%  Weight:      Height:        Intake/Output Summary (Last 24 hours) at 01/09/2020 1218 Last data filed at 01/09/2020 0000 Gross per 24 hour  Intake 520.21 ml  Output --  Net 520.21 ml   Filed Weights   01/07/20 1025 01/07/20 2206  Weight: 77.1 kg 66.1 kg    Examination:  General exam: awake, alert, no acute distress HEENT: moist mucus membranes, hearing grossly normal  Respiratory system: clear to auscultation bilaterally, no wheezes, rales or rhonchi, normal respiratory effort. Cardiovascular system: normal S1/S2, RRR, no pedal edema.   Gastrointestinal system: soft, non-tender, non-distended  Central nervous system: alert and oriented x4. no gross focal neurologic deficits, normal speech Extremities: moves all, no cyanosis, normal tone Skin: dry, intact, normal temperature,  Psychiatry: labile mood, congruent affect, abnormal judgement and insight     Data Reviewed: I have personally reviewed following labs and imaging studies  CBC: Recent Labs  Lab 01/07/20 1110 01/08/20 0401  WBC 12.2* 14.6*  NEUTROABS 11.0*  --   HGB 12.5* 11.6*  HCT 37.7* 36.6*  MCV 100.0 102.2*  PLT 227 119   Basic Metabolic Panel: Recent Labs  Lab 01/07/20 1110 01/07/20 1824 01/08/20 0401 01/09/20 0353  NA 137  --  140 143  K 4.1  --  3.2* 3.2*  CL 100  --  101 105  CO2 30  --  30 30  GLUCOSE 140*  --  119* 107*   BUN 18  --  18 20  CREATININE 0.99 0.86 0.87 0.96  CALCIUM 9.4  --  9.0 8.8*  MG  --   --   --  2.1   GFR: Estimated Creatinine Clearance: 53.6 mL/min (by C-G formula based on SCr of 0.96 mg/dL). Liver Function Tests: Recent Labs  Lab 01/07/20 1110  AST 21  ALT 12  ALKPHOS 50  BILITOT 0.6  PROT 6.9  ALBUMIN 3.7   Recent Labs  Lab 01/07/20 1110  LIPASE 15   No results for input(s): AMMONIA in the last 168 hours. Coagulation Profile: No results for input(s): INR, PROTIME in the last 168 hours. Cardiac Enzymes: No results for input(s): CKTOTAL, CKMB, CKMBINDEX, TROPONINI in the last 168 hours. BNP (last 3 results) No results for input(s): PROBNP in the last 8760 hours. HbA1C: No results for input(s): HGBA1C in the last 72 hours. CBG: No results for input(s): GLUCAP in the last 168 hours. Lipid Profile: No results for input(s): CHOL, HDL, LDLCALC, TRIG, CHOLHDL, LDLDIRECT in the last 72 hours. Thyroid Function Tests: No results for input(s): TSH, T4TOTAL, FREET4, T3FREE, THYROIDAB in the last 72 hours. Anemia Panel: No results for input(s): VITAMINB12, FOLATE, FERRITIN, TIBC, IRON, RETICCTPCT in the last 72 hours. Sepsis Labs: Recent Labs  Lab 01/07/20 1116 01/07/20 1824  LATICACIDVEN 2.1* 2.1*    Recent Results (from the past 240 hour(s))  Respiratory Panel by RT PCR (Flu A&B, Covid) -     Status: None   Collection Time: 01/07/20  2:25 PM  Result Value Ref Range Status   SARS Coronavirus 2 by RT PCR NEGATIVE NEGATIVE Final    Comment: (NOTE) SARS-CoV-2 target nucleic acids are NOT DETECTED. The SARS-CoV-2 RNA is generally detectable in upper respiratoy specimens during the acute phase of infection. The lowest concentration of SARS-CoV-2 viral copies this assay can detect is 131 copies/mL. A negative result does not preclude SARS-Cov-2 infection and should not be used as the sole basis for treatment or other patient management decisions. A negative result may  occur with  improper specimen collection/handling, submission of specimen other than nasopharyngeal swab, presence of viral mutation(s) within the areas targeted by this assay, and inadequate number of viral copies (<131 copies/mL). A negative result must be combined with clinical observations, patient history, and epidemiological information. The expected result is Negative. Fact Sheet for Patients:  PinkCheek.be Fact Sheet for Healthcare Providers:  GravelBags.it This test is not yet ap proved or cleared by the [place] FDA and  has been authorized for detection and/or diagnosis of SARS-CoV-2 by FDA under an Emergency Use Authorization (EUA). This EUA will remain  in effect (meaning this test can be used) for the duration of the COVID-19 declaration under Section 564(b)(1) of the Act, 21 U.S.C. section 360bbb-3(b)(1), unless the authorization is terminated or revoked sooner.    Influenza A by PCR NEGATIVE NEGATIVE Final   Influenza B by PCR NEGATIVE NEGATIVE Final    Comment: (NOTE) The Xpert Xpress SARS-CoV-2/FLU/RSV assay is intended as an aid  in  the diagnosis of influenza from Nasopharyngeal swab specimens and  should not be used as a sole basis for treatment. Nasal washings and  aspirates are unacceptable for Xpert Xpress SARS-CoV-2/FLU/RSV  testing. Fact Sheet for Patients: https://www.moore.com/ Fact Sheet for Healthcare Providers: https://www.young.biz/ This test is not yet approved or cleared by the [place] FDA and  has been authorized for detection and/or diagnosis of SARS-CoV-2 by  FDA under an Emergency Use Authorization (EUA). This EUA will remain  in effect (meaning this test can be used) for the duration of the  Covid-19 declaration under Section 564(b)(1) of the Act, 21  U.S.C. section 360bbb-3(b)(1), unless the authorization is  terminated or  revoked. Performed at [facility], [address] [place], [place] [address]   Culture, blood (routine x 2)     Status: None (Preliminary result)   Collection Time: 01/07/20  2:25 PM   Specimen: BLOOD  Result Value Ref Range Status   Specimen Description   Final    BLOOD Blood Culture results may not be optimal due to an inadequate volume of blood received in culture bottles   Special Requests   Final    BOTTLES DRAWN AEROBIC AND ANAEROBIC BLOOD RIGHT ARM   Culture   Final    NO GROWTH 2 DAYS Performed at [facility], [address] [place], [place] [address]    Report Status PENDING  Incomplete  Culture, blood (routine x 2)     Status: None (Preliminary result)   Collection Time: 01/07/20 11:45 PM   Specimen: BLOOD  Result Value Ref Range Status   Specimen Description BLOOD BLOOD LEFT WRIST  Final   Special Requests   Final    BOTTLES DRAWN AEROBIC AND ANAEROBIC Blood Culture adequate volume   Culture   Final    NO GROWTH 2 DAYS Performed at [facility], [address], [place], [place] [address]    Report Status PENDING  Incomplete  MRSA PCR Screening     Status: None   Collection Time: 01/08/20  5:27 AM   Specimen: Nasopharyngeal  Result Value Ref Range Status   MRSA by PCR NEGATIVE NEGATIVE Final    Comment:        The GeneXpert MRSA Assay (FDA approved for NASAL specimens only), is one component of a comprehensive MRSA colonization surveillance program. It is not intended to diagnose MRSA infection nor to guide or monitor treatment for MRSA infections. Performed at [facility], [address], [place], [place] [address]          Radiology Studies: CT Head Wo Contrast  Result Date: 01/07/2020 CLINICAL DATA:  86 year old male with altered mental status. EXAM: CT HEAD WITHOUT CONTRAST TECHNIQUE: Contiguous axial images were obtained from the base of the skull through the vertex without intravenous contrast.  COMPARISON:  08/05/2019 FINDINGS: Brain: No evidence of acute infarction, hemorrhage, hydrocephalus, extra-axial collection or mass lesion/mass effect. Atrophy and mild chronic small-vessel white matter ischemic changes. Vascular: Carotid atherosclerotic calcifications noted. Skull: Normal. Negative for fracture or focal lesion. Sinuses/Orbits: No acute finding. Other: None. IMPRESSION: 1. No evidence of acute intracranial abnormality. 2. Atrophy and chronic small-vessel white matter ischemic changes. Electronically Signed   By: Harmon Pier M.D.   On: 01/07/2020 17:13   CT Abdomen Pelvis W Contrast  Result Date: 01/07/2020 CLINICAL DATA:  86 year old male with fever and abdominal pain. EXAM: CT ABDOMEN AND PELVIS WITH CONTRAST TECHNIQUE: Multidetector CT imaging of the abdomen and pelvis  was performed using the standard protocol following bolus administration of intravenous contrast. CONTRAST:  OMNIPAQUE IOHEXOL 300 MG/ML  SOLN COMPARISON:  None. FINDINGS: Lower chest: LEFT basilar scarring/atelectasis noted. Hepatobiliary: The liver and gallbladder are unremarkable. No biliary dilatation. Pancreas: Unremarkable Spleen: Unremarkable Adrenals/Urinary Tract: 2 punctate nonobstructing RIGHT LOWER pole renal calculi are identified. A RIGHT LOWER pole renal cyst is present. No obstructing urinary calculi noted. Mild circumferential bladder wall thickening is present. The adrenal glands are unremarkable. Stomach/Bowel: Stomach is within normal limits. No evidence of bowel wall thickening, distention, or inflammatory changes. Colonic diverticulosis noted without evidence of acute diverticulitis. Vascular/Lymphatic: Aortic atherosclerosis. No enlarged abdominal or pelvic lymph nodes. Reproductive: Mild prostate enlargement Other: No ascites, focal collection or pneumoperitoneum Musculoskeletal: No acute or significant osseous findings. Degenerative changes within the lumbar spine identified IMPRESSION: 1. No  evidence of acute abnormality. 2. Colonic diverticulosis without evidence of acute diverticulitis 3. Nonobstructing RIGHT LOWER pole renal calculi. 4. Mild circumferential bladder wall thickening, favor mild chronic bilateral outlet obstruction over cystitis. Correlate clinically. 5.  Aortic Atherosclerosis (ICD10-I70.0). Electronically Signed   By: Harmon Pier M.D.   On: 01/07/2020 17:05   DG Foot Complete Right  Result Date: 01/07/2020 CLINICAL DATA:  Foot pain and swelling. Lateral foot redness. EXAM: RIGHT FOOT COMPLETE - 3+ VIEW COMPARISON:  None. FINDINGS: Soft tissue swelling is noted along the lateral and dorsal aspects of the foot. No acute fracture, dislocation, osseous erosion, radiopaque foreign body, or soft tissue emphysema is identified. IMPRESSION: Soft tissue swelling without acute osseous abnormality. Electronically Signed   By: Sebastian Ache M.D.   On: 01/07/2020 16:28   US Abdomen Limited RUQ  Result Date: 01/08/2020 CLINICAL DATA:  Abdominal pain. EXAM: ULTRASOUND ABDOMEN LIMITED RIGHT UPPER QUADRANT COMPARISON:  CT abdomen and pelvis 01/07/2020 FINDINGS: Gallbladder: No gallstones or wall thickening visualized. No sonographic Murphy sign noted by sonographer. Common bile duct: Diameter: 6 mm Liver: No focal lesion identified. Within normal limits in parenchymal echogenicity. Portal vein is patent on color Doppler imaging with normal direction of blood flow towards the liver. Other: None. IMPRESSION: Unremarkable right upper quadrant ultrasound. Electronically Signed   By: Sebastian Ache M.D.   On: 01/08/2020 08:15        Scheduled Meds: . amLODipine  10 mg Oral Daily  . aspirin EC  81 mg Oral QPM  . atorvastatin  40 mg Oral q1800  . clopidogrel  75 mg Oral Daily  . enoxaparin (LOVENOX) injection  40 mg Subcutaneous Q24H  . metoprolol succinate  50 mg Oral QPM  . potassium chloride  40 mEq Oral Q4H  . QUEtiapine  12.5 mg Oral BID  . risperiDONE  0.5 mg Oral TID  . traZODone   50 mg Oral QHS   Continuous Infusions: . 0.9 % NaCl with KCl 20 mEq / L 40 mL/hr at 01/08/20 2212  .  ceFAZolin (ANCEF) IV 2 g (01/09/20 0553)     LOS: 2 days    Time spent: 35 minutes    Pennie Banter, DO [facility]   If 7PM-7AM, please contact night-coverage [email or web address] 01/09/2020, 12:18 PM

## 2020-01-09 NOTE — Progress Notes (Signed)
Pulled IV out. Incontintent of stool and urine. Feces on siderails, floor and hands. Cleaned. Pt. Refuses an IV restart. Disoriented to place and time.

## 2020-01-09 NOTE — Progress Notes (Signed)
Patient right hand and arm are swollen. Right IV took out. Left forearm 20 G IV put in. Patient tolerated well. Infusing 0.9% NACL w/ KCL 20 meq/l @ 40 ml/hr.  Patient has mostly been sleeping. Barely opens eyes when talking to him but will respond with okay.

## 2020-01-10 LAB — CBC WITH DIFFERENTIAL/PLATELET
Abs Immature Granulocytes: 0.03 10*3/uL (ref 0.00–0.07)
Basophils Absolute: 0 10*3/uL (ref 0.0–0.1)
Basophils Relative: 0 %
Eosinophils Absolute: 0 10*3/uL (ref 0.0–0.5)
Eosinophils Relative: 0 %
HCT: 35.7 % — ABNORMAL LOW (ref 39.0–52.0)
Hemoglobin: 11.9 g/dL — ABNORMAL LOW (ref 13.0–17.0)
Immature Granulocytes: 0 %
Lymphocytes Relative: 9 %
Lymphs Abs: 0.8 10*3/uL (ref 0.7–4.0)
MCH: 32.4 pg (ref 26.0–34.0)
MCHC: 33.3 g/dL (ref 30.0–36.0)
MCV: 97.3 fL (ref 80.0–100.0)
Monocytes Absolute: 0.9 10*3/uL (ref 0.1–1.0)
Monocytes Relative: 10 %
Neutro Abs: 7 10*3/uL (ref 1.7–7.7)
Neutrophils Relative %: 81 %
Platelets: 221 10*3/uL (ref 150–400)
RBC: 3.67 MIL/uL — ABNORMAL LOW (ref 4.22–5.81)
RDW: 12.4 % (ref 11.5–15.5)
WBC: 8.7 10*3/uL (ref 4.0–10.5)
nRBC: 0 % (ref 0.0–0.2)

## 2020-01-10 LAB — BASIC METABOLIC PANEL
Anion gap: 8 (ref 5–15)
BUN: 10 mg/dL (ref 8–23)
CO2: 27 mmol/L (ref 22–32)
Calcium: 8.6 mg/dL — ABNORMAL LOW (ref 8.9–10.3)
Chloride: 104 mmol/L (ref 98–111)
Creatinine, Ser: 0.67 mg/dL (ref 0.61–1.24)
GFR calc Af Amer: >60 mL/min (ref >60)
GFR calc non Af Amer: >60 mL/min (ref >60)
Glucose, Bld: 116 mg/dL — ABNORMAL HIGH (ref 70–99)
Potassium: 3.3 mmol/L — ABNORMAL LOW (ref 3.5–5.1)
Sodium: 139 mmol/L (ref 135–145)

## 2020-01-10 LAB — MAGNESIUM: Magnesium: 2.2 mg/dL (ref 1.7–2.4)

## 2020-01-10 MED ORDER — QUETIAPINE FUMARATE 25 MG PO TABS
25.0000 mg | ORAL_TABLET | Freq: Two times a day (BID) | ORAL | Status: DC
  Administered 2020-01-10 – 2020-01-11 (×3): 25 mg via ORAL
  Filled 2020-01-10 (×3): qty 1

## 2020-01-10 MED ORDER — LISINOPRIL 20 MG PO TABS
20.0000 mg | ORAL_TABLET | Freq: Every day | ORAL | Status: DC
  Administered 2020-01-11: 20 mg via ORAL
  Filled 2020-01-10: qty 1

## 2020-01-10 MED ORDER — VENLAFAXINE HCL 37.5 MG PO TABS
75.0000 mg | ORAL_TABLET | Freq: Two times a day (BID) | ORAL | Status: DC
  Administered 2020-01-10 – 2020-01-11 (×2): 75 mg via ORAL
  Filled 2020-01-10 (×3): qty 2

## 2020-01-10 NOTE — TOC Progression Note (Signed)
Transition of Care [facility]) - Progression Note    Patient Details  Name: Glenn Barker MRN: [id number] Date of Birth: January 07, 1933  Transition of Care [facility]) CM/SW Contact  Claudio Mondry, Lemar Livings, LCSW Phone Number: 01/10/2020, 3:46 PM  Clinical Narrative:  Spoke with Sam-RN liaison with [facility] they can take him back tomorrow. Wife is here and have informed her of this. Will also inform MD and ask to do paperwork for tomorrow. Pt will need to go EMS back to facility-per facility. See in am     Expected Discharge Plan: Assisted Living Barriers to Discharge: Continued Medical Work up  Expected Discharge Plan and Services Expected Discharge Plan: Assisted Living In-house Referral: Clinical Social Work     Living arrangements for the past 2 months: Assisted Living Facility                                       Social Determinants of Health (SDOH) Interventions    Readmission Risk Interventions No flowsheet data found.

## 2020-01-10 NOTE — TOC Progression Note (Addendum)
Transition of Care [facility]) - Progression Note    Patient Details  Name: Glenn Barker MRN: [id number] Date of Birth: October 18, 1933  Transition [facility]) CM/SW Contact  Anallely Rosell, Lemar Livings, LCSW Phone Number: 01/10/2020, 11:34 AM  Clinical Narrative:  Spoke with wife to discuss possibility of transferring back to [facility], but need to see how he moves. Wife reports he was not moving well prior to admission and has not been out of bed since admission to the hospital. Have asked RN to get pt up or MD to order a PT evaluation. Sam-RN [place] wants paperwork faxed to her and then she will decide if she needs to come and evaluate pt here. Await RN input   11:50 am PT eval placed by MD  Expected Discharge Plan: Assisted Living Barriers to Discharge: Continued Medical Work up  Expected Discharge Plan and Services Expected Discharge Plan: Assisted Living In-house Referral: Clinical Social Work     Living arrangements for the past 2 months: Assisted Living Facility                                       Social Determinants of Health (SDOH) Interventions    Readmission Risk Interventions No flowsheet data found.

## 2020-01-10 NOTE — TOC Initial Note (Signed)
Transition of Care [facility]) - Initial/Assessment Note    Patient Details  Name: Raequon Catanzaro MRN: [id number] Date of Birth: May 06, 1933  Transition of Care [facility]) CM/SW Contact:    Lucy Chris, LCSW Phone Number: 01/10/2020, 10:45 AM  Clinical Narrative:    Md feels pt can possibly return to [facility] where he resides. Wife is also involved but only visits him and can not assist with his care due to unable too. Awaiting facility-Sam to call back regarding taking pt back.          Expected Discharge Plan: Assisted Living Barriers to Discharge: Continued Medical Work up   Patient Goals and CMS Choice        Expected Discharge Plan and Services Expected Discharge Plan: Assisted Living In-house Referral: Clinical Social Work     Living arrangements for the past 2 months: Assisted Living Facility                                      Prior Living Arrangements/Services Living arrangements for the past 2 months: Assisted Living Facility Lives with:: Facility Resident                   Activities of Daily Living   ADL Screening (condition at time of admission) Is the patient deaf or have difficulty hearing?: No Does the patient have difficulty seeing, even when wearing glasses/contacts?: No Does the patient have difficulty concentrating, remembering, or making decisions?: Yes Does the patient have difficulty dressing or bathing?: Yes Does the patient have difficulty walking or climbing stairs?: Yes  Permission Sought/Granted Permission sought to share information with : Oceanographer granted to share information with : Yes, Verbal Permission Granted  Share Information with NAME: Sam-RN  Permission granted to share info w AGENCY: [employer]  Permission granted to share info w Relationship: Wife     Emotional Assessment Appearance:: Appears stated age Attitude/Demeanor/Rapport: Lethargic Affect (typically observed):  Calm Orientation: : Oriented to Self      Admission diagnosis:  SIRS (systemic inflammatory response syndrome) (HCC) [R65.10] Cellulitis of right foot [L03.115] Abdominal pain [R10.9] Sepsis (HCC) [A41.9] Patient Active Problem List   Diagnosis Date Noted  . Acute metabolic encephalopathy 01/09/2020  . Lactic acidosis   . Sepsis (HCC) 01/07/2020  . Cellulitis of right lower extremity   . RUQ pain   . Dementia with behavioral disturbance (HCC)   . Essential hypertension    PCP:  [facility] Pharmacy:   [facility] - [place] - [address] [place] [address] Phone: [phone] Fax: [phone]     Social Determinants of Health (SDOH) Interventions    Readmission Risk Interventions No flowsheet data found.

## 2020-01-10 NOTE — TOC Progression Note (Signed)
Transition of Care [facility]) - Progression Note    Patient Details  Name: Glenn Barker MRN: [id number] Date of Birth: 10-Mar-1933  Transition of Care [facility]) CM/SW Contact  Juanell Saffo, Lemar Livings, LCSW Phone Number: 01/10/2020, 10:49 AM  Clinical Narrative:  Spoke with Sam_RN at [facility] she will need to come and do an assessment prior to approving pt to return to their facility. She will call this worker when here today.     Expected Discharge Plan: Assisted Living Barriers to Discharge: Continued Medical Work up  Expected Discharge Plan and Services Expected Discharge Plan: Assisted Living In-house Referral: Clinical Social Work     Living arrangements for the past 2 months: Assisted Living Facility                                       Social Determinants of Health (SDOH) Interventions    Readmission Risk Interventions No flowsheet data found.

## 2020-01-10 NOTE — Consult Note (Signed)
Pharmacy Antibiotic Note  Glenn Barker is a 86 y.o. male admitted on 01/07/2020 with sepsis/cellulitis. Pharmacy has been consulted for Cefazolin dosing.  Plan: Day 4 IV abx. Continue Cefazolin 2 gm IV q8h   Height: 5\' 10"  (177.8 cm) Weight: 145 lb 12.8 oz (66.1 kg) IBW/kg (Calculated) : 73  Temp (24hrs), Avg:98.1 F (36.7 C), Min:97.4 F (36.3 C), Max:98.8 F (37.1 C)  Recent Labs  Lab 01/07/20 1110 01/07/20 1116 01/07/20 1824 01/08/20 0401 01/09/20 0353 01/09/20 1250 01/10/20 0659  WBC 12.2*  --   --  14.6*  --   --  8.7  CREATININE 0.99  --  0.86 0.87 0.96  --  0.67  LATICACIDVEN  --  2.1* 2.1*  --   --  1.3  --     Estimated Creatinine Clearance: 64.3 mL/min (by C-G formula based on SCr of 0.67 mg/dL).    Allergies  Allergen Reactions  . Gabapentin   . Pregabalin     Antimicrobials this admission: Vancomycin 1/25 >> 1/26 Zosyn 1/25 >> 1/26 Cefazolin 1/26 >>   Microbiology results: Respiratory panel pending BCx 1/25 pending  Thank you for allowing pharmacy to be a part of this patient's care.  2/25, PharmD, BCPS Clinical Pharmacist 01/10/2020 9:41 AM

## 2020-01-10 NOTE — Progress Notes (Signed)
Haldol adm IV after restarting IV. Mitten placed on right hand to help prevent pulling IV out.

## 2020-01-10 NOTE — NC FL2 (Addendum)
[place] LEVEL OF CARE SCREENING TOOL     IDENTIFICATION  Patient Name: Glenn Barker Birthdate: 1933/03/09 Sex: male Admission Date (Current Location): 01/07/2020  [place] and [place] Number:  Engineering geologist and Address:  [facility], [address], [place],  [address]      Provider Number: [id number]  Attending Physician Name and Address:  Ezekiel Slocumb, DO  Relative Name and Phone Number:  Wasil Wolke  [phone]    Current Level of Care: Hospital Recommended Level of Care: Memory Care Prior Approval Number:    Date Approved/Denied:   PASRR Number:    Discharge Plan: Other (Comment)(memory care-ALF)    Current Diagnoses: Patient Active Problem List   Diagnosis Date Noted  . Acute metabolic encephalopathy 60/09/9322  . Lactic acidosis   . Sepsis ([place]) 01/07/2020  . Cellulitis of right lower extremity   . RUQ pain   . Dementia with behavioral disturbance ([place])   . Essential hypertension     Orientation RESPIRATION BLADDER Height & Weight     Self  Normal Incontinent Weight: 145 lb 12.8 oz (66.1 kg) Height:  5\' 10"  (177.8 cm)  BEHAVIORAL SYMPTOMS/MOOD NEUROLOGICAL BOWEL NUTRITION STATUS      Incontinent Diet(soft thin liquids)  AMBULATORY STATUS COMMUNICATION OF NEEDS Skin   Limited Assist(uses a rw)   Other (Comment)(cellulitis leg)                       Personal Care Assistance Level of Assistance  Bathing, Feeding, Dressing Bathing Assistance: Maximum assistance Feeding assistance: Limited assistance Dressing Assistance: Maximum assistance     Functional Limitations Info  Speech     Speech Info: Impaired    SPECIAL CARE FACTORS FREQUENCY                       Contractures Contractures Info: Not present    Additional Factors Info  Code Status, Allergies Code Status Info: DNR Allergies Info: gabpentin and Pregbalin           rrCuent Medications (01/10/2020):   This is the current hospital active medication list    Discharge Medications: Please see discharge summary for a list of discharge medications.  Relevant Imaging Results: Medication List        TAKE these medications       acetaminophen 500 MG tablet Commonly known as: TYLENOL Take 1,000 mg by mouth 3 (three) times daily.   amLODipine 10 MG tablet Commonly known as: NORVASC Take 10 mg by mouth daily.   aspirin EC 81 MG tablet Take 81 mg by mouth every evening.   atorvastatin 40 MG tablet Commonly known as: LIPITOR Take 1 tablet (40 mg total) by mouth daily at 6 PM. What changed: when to take this   bacitracin-polymyxin b ointment Commonly known as: POLYSPORIN Apply topically daily. Left shin   cephALEXin 500 MG capsule Commonly known as: KEFLEX Take 1 capsule (500 mg total) by mouth 4 (four) times daily for 3 days.   CHAPSTICK EX Apply topically 3 (three) times daily as needed.   cholecalciferol 25 MCG (1000 UNIT) tablet Commonly known as: VITAMIN D3 Take 2,000 Units by mouth daily.   clopidogrel 75 MG tablet Commonly known as: PLAVIX Take 75 mg by mouth daily.   ICY HOT NO-MESS VAPOR GEL EX Apply 1 application topically 3 (three) times daily.   lisinopril 20 MG tablet Commonly known as: ZESTRIL Take 20 mg by  mouth daily.   metoprolol succinate 50 MG 24 hr tablet Commonly known as: TOPROL-XL Take 50 mg by mouth every evening.   potassium chloride SA 20 MEQ tablet Commonly known as: KLOR-CON Take 20 mEq by mouth 2 (two) times daily.   PRESCRIPTION MEDICATION See admin instructions. Lorazepam 0.5mg / 0.67ml gel= 0.40ml every 4 hours as needed for agitation   QUEtiapine 25 MG tablet Commonly known as: SEROQUEL Take 1 tablet (25 mg total) by mouth 2 (two) times daily. What changed: how much to take   risperiDONE 0.5 MG tablet Commonly known as: RISPERDAL Take 0.5 mg by mouth 3 (three) times daily.   traMADol 50 MG tablet Commonly  known as: ULTRAM Take 50 mg by mouth 2 (two) times daily.   traZODone 50 MG tablet Commonly known as: DESYREL Take 1 tablet (50 mg total) by mouth at bedtime. What changed:   how much to take  when to take this   venlafaxine 75 MG tablet Commonly known as: EFFEXOR Take 1 tablet (75 mg total) by mouth 2 (two) times daily with a meal. What changed: when to take this   vitamin B-12 1000 MCG tablet Commonly known as: CYANOCOBALAMIN Take 1,000 mcg by mouth daily.                        Durable Medical Equipment  (From admission, onward)       Relevant Lab Results:   Additional Information SSN:[id number]  Lucy Chris, LCSW

## 2020-01-10 NOTE — Evaluation (Signed)
Physical Therapy Evaluation Patient Details Name: Glenn Barker MRN: [id number] DOB: [birth year] Today's Date: 01/10/2020   History of Present Illness  Pt is an 86 y.o. male presenting to hospital 01/07/20 with fever and AMS; redness R LE; noted to be agitated in ED.  Pt admitted with clinical sepsis with acute metabolic encephalopathy, fever, leukocytosis, cellulitis R LE, and abdominal pain.  PMH includes dementia, htn, and CAD.  Clinical Impression  Prior to hospital admission, per care management pt was ambulatory with walker; lives at [facility]F (memory care).  Currently pt is CGA semi-supine to sitting edge of bed; CGA to min assist with transfers with RW use; and CGA to min assist with ambulation 140 feet with RW.  Pt tending to stand up with weight through heels (causing posterior lean) and also when pt was distracted walking and took one hand off walker to point, pt again with weight through heels (causing posterior lean) requiring min assist for balance.  Pt very talkative during session and appearing confused in general; pt very pleasant; also appearing very distractible during session's activities.  Pt would benefit from skilled PT to address noted impairments and functional limitations (see below for any additional details).  Upon hospital discharge, pt would benefit from HHPT and 24/7 assist with functional mobility for safety (discussed with care management).    Follow Up Recommendations Home health PT;Supervision/Assistance - 24 hour    Equipment Recommendations  Rolling walker with 5" wheels;3in1 (PT)    Recommendations for Other Services       Precautions / Restrictions Precautions Precautions: Fall Restrictions Weight Bearing Restrictions: No      Mobility  Bed Mobility Overal bed mobility: Needs Assistance Bed Mobility: Supine to Sit     Supine to sit: Min guard;HOB elevated     General bed mobility comments: mild increased effort and time to perform  semi-supine to sitting edge of bed  Transfers Overall transfer level: Needs assistance Equipment used: Rolling walker (2 wheeled) Transfers: Sit to/from [employer] Sit to Stand: Min guard;Min assist Stand pivot transfers: Min guard;Min assist       General transfer comment: pt tending to stand with B LE's pushing against bed or chair for stability d/t weight on B heels (vc's and tactile cues given to shift weight forward and use of walker); stand step turn bed to recliner with RW (first steps pt took with RW pt's B knees suddenly flexed but pt able to self correct and no further episodes of this noted during session)  Ambulation/Gait Ambulation/Gait assistance: Min guard;Min assist;+2 safety/equipment(2nd assist for IV pole management) Gait Distance (Feet): 140 Feet Assistive device: Rolling walker (2 wheeled)   Gait velocity: decreased   General Gait Details: decreased B LE step length/foot clearance/heelstrike; with vc's to take longer steps pt started to walk with increased cadence requiring cueing to slow down; pt suddenly almost jogging with walker for a few feet (CGA for safety) requiring cueing to walk instead; occasional min assist for balance (when pt distracted and taking 1 UE off of walker to point pt tending to place weight through B heels requiring assist to prevent posterior loss of balance)  Stairs            Wheelchair Mobility    Modified Rankin (Stroke Patients Only)       Balance Overall balance assessment: Needs assistance Sitting-balance support: No upper extremity supported;Feet supported Sitting balance-Leahy Scale: Good Sitting balance - Comments: steady sitting reaching within BOS   Standing  balance support: Single extremity supported Standing balance-Leahy Scale: Poor Standing balance comment: pt requiring at least single UE support for static standing balance (CGA); min assist for balance with single UE support and other UE  pointing (pt tending to go onto heels requiring min assist to prevent posterior loss of balance)                             Pertinent Vitals/Pain Pain Assessment: Faces Faces Pain Scale: No hurt Pain Intervention(s): Limited activity within patient's tolerance;Monitored during session;Repositioned  Vitals (HR and O2 on room air) stable and WFL throughout treatment session.    Home Living Family/patient expects to be discharged to:: Assisted living     Type of Home: Assisted living           Additional Comments: Per case management pt lives at [facility] (memory care)    Prior Function           Comments: Per care management pt ambulatory with walker.     Hand Dominance        Extremity/Trunk Assessment   Upper Extremity Assessment Upper Extremity Assessment: Generalized weakness    Lower Extremity Assessment Lower Extremity Assessment: Generalized weakness    Cervical / Trunk Assessment Cervical / Trunk Assessment: Normal  Communication   Communication: No difficulties  Cognition Arousal/Alertness: Awake/alert Behavior During Therapy: WFL for tasks assessed/performed Overall Cognitive Status: No family/caregiver present to determine baseline cognitive functioning                                 General Comments: Oriented to name only      General Comments   Nursing cleared pt for participation in physical therapy.  Pt agreeable to PT session.    Exercises     Assessment/Plan    PT Assessment Patient needs continued PT services  PT Problem List Decreased strength;Decreased activity tolerance;Decreased balance;Decreased mobility;Decreased knowledge of use of DME;Decreased safety awareness       PT Treatment Interventions DME instruction;Gait training;Functional mobility training;Therapeutic activities;Therapeutic exercise;Balance training;Patient/family education    PT Goals (Current goals can be found in the Care  Plan section)  Acute Rehab PT Goals Patient Stated Goal: to improve walking PT Goal Formulation: With patient Time For Goal Achievement: 01/24/20 Potential to Achieve Goals: Fair    Frequency Min 2X/week   Barriers to discharge        Co-evaluation               AM-PAC PT "6 Clicks" Mobility  Outcome Measure Help needed turning from your back to your side while in a flat bed without using bedrails?: A Little Help needed moving from lying on your back to sitting on the side of a flat bed without using bedrails?: A Little Help needed moving to and from a bed to a chair (including a wheelchair)?: A Little Help needed standing up from a chair using your arms (e.g., wheelchair or bedside chair)?: A Little Help needed to walk in hospital room?: A Little Help needed climbing 3-5 steps with a railing? : A Little 6 Click Score: 18    End of Session Equipment Utilized During Treatment: Gait belt Activity Tolerance: Patient tolerated treatment well Patient left: in chair;with call bell/phone within reach;with chair alarm set;with nursing/sitter in room Nurse Communication: Mobility status;Precautions PT Visit Diagnosis: Other abnormalities of gait and mobility (R26.89);Muscle  weakness (generalized) (M62.81);Difficulty in walking, not elsewhere classified (R26.2)    Time: 4627-0350 PT Time Calculation (min) (ACUTE ONLY): 38 min   Charges:   PT Evaluation $PT Eval Low Complexity: 1 Low PT Treatments $Therapeutic Exercise: 8-22 mins $Therapeutic Activity: 8-22 mins        Hendricks Limes, PT 01/10/20, 1:43 PM

## 2020-01-10 NOTE — Care Management Important Message (Signed)
Important Message  Patient Details  Name: Glenn Barker MRN: [id number] Date of Birth: [birth year]   Medicare Important Message Given:  Yes     Olegario Messier A Artis Buechele 01/10/2020, 11:05 AM

## 2020-01-11 LAB — CBC WITH DIFFERENTIAL/PLATELET
Abs Immature Granulocytes: 0.03 10*3/uL (ref 0.00–0.07)
Basophils Absolute: 0 10*3/uL (ref 0.0–0.1)
Basophils Relative: 0 %
Eosinophils Absolute: 0.3 10*3/uL (ref 0.0–0.5)
Eosinophils Relative: 4 %
HCT: 33.7 % — ABNORMAL LOW (ref 39.0–52.0)
Hemoglobin: 11.1 g/dL — ABNORMAL LOW (ref 13.0–17.0)
Immature Granulocytes: 0 %
Lymphocytes Relative: 15 %
Lymphs Abs: 1.1 10*3/uL (ref 0.7–4.0)
MCH: 32.5 pg (ref 26.0–34.0)
MCHC: 32.9 g/dL (ref 30.0–36.0)
MCV: 98.5 fL (ref 80.0–100.0)
Monocytes Absolute: 0.8 10*3/uL (ref 0.1–1.0)
Monocytes Relative: 11 %
Neutro Abs: 5.1 10*3/uL (ref 1.7–7.7)
Neutrophils Relative %: 70 %
Platelets: 234 10*3/uL (ref 150–400)
RBC: 3.42 MIL/uL — ABNORMAL LOW (ref 4.22–5.81)
RDW: 12.6 % (ref 11.5–15.5)
WBC: 7.2 10*3/uL (ref 4.0–10.5)
nRBC: 0 % (ref 0.0–0.2)

## 2020-01-11 LAB — GLUCOSE, CAPILLARY: Glucose-Capillary: 89 mg/dL (ref 70–99)

## 2020-01-11 LAB — BASIC METABOLIC PANEL
Anion gap: 7 (ref 5–15)
BUN: 14 mg/dL (ref 8–23)
CO2: 27 mmol/L (ref 22–32)
Calcium: 8.5 mg/dL — ABNORMAL LOW (ref 8.9–10.3)
Chloride: 107 mmol/L (ref 98–111)
Creatinine, Ser: 0.71 mg/dL (ref 0.61–1.24)
GFR calc Af Amer: >60 mL/min (ref >60)
GFR calc non Af Amer: >60 mL/min (ref >60)
Glucose, Bld: 113 mg/dL — ABNORMAL HIGH (ref 70–99)
Potassium: 3.5 mmol/L (ref 3.5–5.1)
Sodium: 141 mmol/L (ref 135–145)

## 2020-01-11 LAB — MAGNESIUM: Magnesium: 2 mg/dL (ref 1.7–2.4)

## 2020-01-11 MED ORDER — VENLAFAXINE HCL 75 MG PO TABS: 75.0000 mg | ORAL_TABLET | Freq: Two times a day (BID) | ORAL | Status: AC

## 2020-01-11 MED ORDER — QUETIAPINE FUMARATE 25 MG PO TABS: 25.0000 mg | ORAL_TABLET | Freq: Two times a day (BID) | ORAL | 0 refills | Status: DC

## 2020-01-11 MED ORDER — TRAZODONE HCL 50 MG PO TABS: 50.0000 mg | ORAL_TABLET | Freq: Every day | ORAL | Status: AC

## 2020-01-11 MED ORDER — ATORVASTATIN CALCIUM 40 MG PO TABS: 40.0000 mg | ORAL_TABLET | Freq: Every day | ORAL | Status: AC

## 2020-01-11 MED ORDER — CEPHALEXIN 500 MG PO CAPS: 500.0000 mg | ORAL_CAPSULE | Freq: Four times a day (QID) | ORAL | 0 refills | Status: AC

## 2020-01-11 NOTE — TOC Transition Note (Signed)
Transition of Care [facility]) - CM/SW Discharge Note   Patient Details  Name: Glenn Barker MRN: [id number] Date of Birth: [birth year]  [facility] CM/SW Contact:  Lucy Chris, LCSW Phone Number: 01/11/2020, 10:25 AM   Clinical Narrative:  Pt medically ready to transfer back to [facility]-wife aware and bedside RN to call report [phone]. Paperwork complete and ready for EMS call.     Final next level of care: Assisted Living Barriers to Discharge: Barriers Resolved   Patient Goals and CMS Choice        Discharge Placement              Patient chooses bed at: Other - please specify in the comment section below:([facility]-Memory Care) Patient to be transferred to facility by: ems Name of family member notified: edith-wife Patient and family notified of of transfer: 01/11/20  Discharge Plan and Services In-house Referral: Clinical Social Work                                   Social Determinants of Health (SDOH) Interventions     Readmission Risk Interventions No flowsheet data found.

## 2020-01-11 NOTE — Progress Notes (Signed)
Pt d/c via EMS to [facility] this afternoon.  Skin warm dry clean and intact reddness to RLE is resolving.  Belongings bag taken at time of discharge.

## 2020-01-11 NOTE — Progress Notes (Addendum)
PROGRESS NOTE    Ac Colan  PXT:[id number] DOB: May 12, 1933 DOA: 01/07/2020  PCP: [facility]    LOS - 4   Brief Narrative:  Patient admitted 01/07/20 with sepsis secondary to right lower extremity cellulitis after presenting from [facility] with fever and altered mental status.  Initially treated with broad spectrum antibiotics.  CT scan of the abdomen and right upper quadrant ultrasound of the abdomen negative for intra-abdominal infection.  Chest x-ray negative for pneumonia.  Urine analysis negative.  Right lower extremity cellulitis only source of infection identified.  Antibiotics de-escalated to Ancef.  Subjective 1/27: Patient seen this AM while eating breakfast.  Reports he feels good.  Denies fever or chills, leg/foot pain improved.       Assessment & Plan:   Principal Problem:   Cellulitis of right lower extremity Active Problems:   Sepsis ([place])   Lactic acidosis   Acute metabolic encephalopathy   Dementia with behavioral disturbance ([facility])   Sepsis secondary to right lower extremity cellulitis Sepsis resolved - vitals stabilized.  Cellulitis appears to be improving. --Continue Ancef  Acute Metabolic Encephalopathy - likely delirium due to infection, superimposed on baseline dementia. Mentation improved, appears likely at his baseline.  No focal neurologic deficits.  Hypokalemia - replaced --monitor daily, replete as needed for K>4.0  Lactic Acidosis - secondary to sepsis.  Lactate 2.1 on admission.  Expect normalization with IV fluids. --repeat pending  Dementia  --continue Seroquel  History of CAD --continue ASA, Plavix, Lipitor, metoprolol  Essential Hypertension  --continue Norvasc, metoprolol   DVT prophylaxis: Lovenox   Code Status: DNR  Family Communication: none at bedside during encounter  Disposition Plan:  Expect return to SNF pending further improvement, estimate 1-2 more days IV antiobiotics and can transition to oral for  discharge.   Consultants:   None  Procedures:   None  Antimicrobials:   Vanc/Zosyn 1/25 >>> discontinued  Ancef 01/08/20 >>>   Objective: Vitals:   01/10/20 1555 01/10/20 2001 01/10/20 2319 01/11/20 0510  BP: (!) 142/93 101/67 132/69   Pulse: 75 62 60 (!) 54  Resp: 18 16 16 16   Temp: 98.6 F (37 C) 98.8 F (37.1 C) 97.7 F (36.5 C)   TempSrc: Oral Oral Oral   SpO2: 95% 96% 96% 97%  Weight:      Height:        Intake/Output Summary (Last 24 hours) at 01/11/2020 0734 Last data filed at 01/11/2020 0600 Gross per 24 hour  Intake 1120.38 ml  Output 100 ml  Net 1020.38 ml   Filed Weights   01/07/20 1025 01/07/20 2206  Weight: 77.1 kg 66.1 kg    Examination:  General exam: awake, alert, no acute distress HEENT: moist mucus membranes, hearing grossly normal  Respiratory system: clear to auscultation bilaterally, no wheezes, rales or rhonchi, normal respiratory effort. Cardiovascular system: normal S1/S2, RRR, no pedal edema.   Gastrointestinal system: soft, non-tender, non-distended  Central nervous system: alert and oriented x4. no gross focal neurologic deficits, normal speech Extremities: RLE erythema nearly resolved, some remaining on the foot, still tender on the foot but leg improved. Skin: dry, intact, normal temperature   Data Reviewed: I have personally reviewed following labs and imaging studies  CBC: Recent Labs  Lab 01/07/20 1110 01/08/20 0401 01/10/20 0659 01/11/20 0431  WBC 12.2* 14.6* 8.7 7.2  NEUTROABS 11.0*  --  7.0 5.1  HGB 12.5* 11.6* 11.9* 11.1*  HCT 37.7* 36.6* 35.7* 33.7*  MCV 100.0 102.2* 97.3 98.5  PLT 227 184 221 234   Basic Metabolic Panel: Recent Labs  Lab 01/07/20 1110 01/07/20 1110 01/07/20 1824 01/08/20 0401 01/09/20 0353 01/10/20 0659 01/11/20 0431  NA 137  --   --  140 143 139 141  K 4.1  --   --  3.2* 3.2* 3.3* 3.5  CL 100  --   --  101 105 104 107  CO2 30  --   --  30 30 27 27   GLUCOSE 140*  --   --  119*  107* 116* 113*  BUN 18  --   --  18 20 10 14   CREATININE 0.99   < > 0.86 0.87 0.96 0.67 0.71  CALCIUM 9.4  --   --  9.0 8.8* 8.6* 8.5*  MG  --   --   --   --  2.1 2.2 2.0   < > = values in this interval not displayed.   GFR: Estimated Creatinine Clearance: 64.3 mL/min (by C-G formula based on SCr of 0.71 mg/dL). Liver Function Tests: Recent Labs  Lab 01/07/20 1110  AST 21  ALT 12  ALKPHOS 50  BILITOT 0.6  PROT 6.9  ALBUMIN 3.7   Recent Labs  Lab 01/07/20 1110  LIPASE 15   No results for input(s): AMMONIA in the last 168 hours. Coagulation Profile: No results for input(s): INR, PROTIME in the last 168 hours. Cardiac Enzymes: No results for input(s): CKTOTAL, CKMB, CKMBINDEX, TROPONINI in the last 168 hours. BNP (last 3 results) No results for input(s): PROBNP in the last 8760 hours. HbA1C: No results for input(s): HGBA1C in the last 72 hours. CBG: No results for input(s): GLUCAP in the last 168 hours. Lipid Profile: No results for input(s): CHOL, HDL, LDLCALC, TRIG, CHOLHDL, LDLDIRECT in the last 72 hours. Thyroid Function Tests: No results for input(s): TSH, T4TOTAL, FREET4, T3FREE, THYROIDAB in the last 72 hours. Anemia Panel: No results for input(s): VITAMINB12, FOLATE, FERRITIN, TIBC, IRON, RETICCTPCT in the last 72 hours. Sepsis Labs: Recent Labs  Lab 01/07/20 1116 01/07/20 1824 01/09/20 1250  LATICACIDVEN 2.1* 2.1* 1.3    Recent Results (from the past 240 hour(s))  Respiratory Panel by RT PCR (Flu A&B, Covid) -     Status: None   Collection Time: 01/07/20  2:25 PM  Result Value Ref Range Status   SARS Coronavirus 2 by RT PCR NEGATIVE NEGATIVE Final    Comment: (NOTE) SARS-CoV-2 target nucleic acids are NOT DETECTED. The SARS-CoV-2 RNA is generally detectable in upper respiratoy specimens during the acute phase of infection. The lowest concentration of SARS-CoV-2 viral copies this assay can detect is 131 copies/mL. A negative result does not preclude  SARS-Cov-2 infection and should not be used as the sole basis for treatment or other patient management decisions. A negative result may occur with  improper specimen collection/handling, submission of specimen other than nasopharyngeal swab, presence of viral mutation(s) within the areas targeted by this assay, and inadequate number of viral copies (<131 copies/mL). A negative result must be combined with clinical observations, patient history, and epidemiological information. The expected result is Negative. Fact Sheet for Patients:  01/11/20 Fact Sheet for Healthcare Providers:  01/09/20 This test is not yet ap proved or cleared by the https://www.moore.com/ FDA and  has been authorized for detection and/or diagnosis of SARS-CoV-2 by FDA under an Emergency Use Authorization (EUA). This EUA will remain  in effect (meaning this test can be used) for the duration of the COVID-19 declaration under Section  564(b)(1) of the Act, 21 U.S.C. section 360bbb-3(b)(1), unless the authorization is terminated or revoked sooner.    Influenza A by PCR NEGATIVE NEGATIVE Final   Influenza B by PCR NEGATIVE NEGATIVE Final    Comment: (NOTE) The Xpert Xpress SARS-CoV-2/FLU/RSV assay is intended as an aid in  the diagnosis of influenza from Nasopharyngeal swab specimens and  should not be used as a sole basis for treatment. Nasal washings and  aspirates are unacceptable for Xpert Xpress SARS-CoV-2/FLU/RSV  testing. Fact Sheet for Patients: https://www.moore.com/ Fact Sheet for Healthcare Providers: https://www.young.biz/ This test is not yet approved or cleared by the [place] FDA and  has been authorized for detection and/or diagnosis of SARS-CoV-2 by  FDA under an Emergency Use Authorization (EUA). This EUA will remain  in effect (meaning this test can be used) for the duration of the  Covid-19  declaration under Section 564(b)(1) of the Act, 21  U.S.C. section 360bbb-3(b)(1), unless the authorization is  terminated or revoked. Performed at [facility], [address] [place], [place] [address]   Culture, blood (routine x 2)     Status: None (Preliminary result)   Collection Time: 01/07/20  2:25 PM   Specimen: BLOOD  Result Value Ref Range Status   Specimen Description   Final    BLOOD Blood Culture results may not be optimal due to an inadequate volume of blood received in culture bottles   Special Requests   Final    BOTTLES DRAWN AEROBIC AND ANAEROBIC BLOOD RIGHT ARM   Culture   Final    NO GROWTH 4 DAYS Performed at [facility], [address], [place], [place] [address]    Report Status PENDING  Incomplete  Culture, blood (routine x 2)     Status: None (Preliminary result)   Collection Time: 01/07/20 11:45 PM   Specimen: BLOOD  Result Value Ref Range Status   Specimen Description BLOOD BLOOD LEFT WRIST  Final   Special Requests   Final    BOTTLES DRAWN AEROBIC AND ANAEROBIC Blood Culture adequate volume   Culture   Final    NO GROWTH 4 DAYS Performed at [facility], [address] [place], [place] [address]    Report Status PENDING  Incomplete  MRSA PCR Screening     Status: None   Collection Time: 01/08/20  5:27 AM   Specimen: Nasopharyngeal  Result Value Ref Range Status   MRSA by PCR NEGATIVE NEGATIVE Final    Comment:        The GeneXpert MRSA Assay (FDA approved for NASAL specimens only), is one component of a comprehensive MRSA colonization surveillance program. It is not intended to diagnose MRSA infection nor to guide or monitor treatment for MRSA infections. Performed at [facility], [address] [place], [place] [address]          Radiology Studies: No results found.      Scheduled Meds: . amLODipine  10 mg Oral Daily  . aspirin EC  81 mg Oral QPM  . atorvastatin  40 mg Oral q1800    . clopidogrel  75 mg Oral Daily  . enoxaparin (LOVENOX) injection  40 mg Subcutaneous Q24H  . lisinopril  20 mg Oral Daily  . metoprolol succinate  50 mg Oral QPM  . QUEtiapine  25 mg Oral BID  . risperiDONE  0.5 mg Oral TID  . traZODone  50 mg Oral QHS  . venlafaxine  75 mg Oral BID WC  Continuous Infusions: . 0.9 % NaCl with KCl 20 mEq / L 40 mL/hr at 01/11/20 0600  .  ceFAZolin (ANCEF) IV 200 mL/hr at 01/11/20 0504     LOS: 4 days    Time spent: 25 minutes    Pennie Banter, DO [facility]   If 7PM-7AM, please contact night-coverage [email or web address] 01/10/2020, 8:34 AM

## 2020-01-11 NOTE — Progress Notes (Signed)
Report called to Med Aide at [facility].

## 2020-01-11 NOTE — Consult Note (Signed)
Pharmacy Antibiotic Note  Glenn Barker is a 86 y.o. male admitted on 01/07/2020 with sepsis/cellulitis. Pharmacy has been consulted for Cefazolin dosing.  Plan: Day 5 IV abx. Continue Cefazolin 2 gm IV q8h   Height: 5\' 10"  (177.8 cm) Weight: 145 lb 12.8 oz (66.1 kg) IBW/kg (Calculated) : 73  Temp (24hrs), Avg:98.1 F (36.7 C), Min:97.7 F (36.5 C), Max:98.8 F (37.1 C)  Recent Labs  Lab 01/07/20 1110 01/07/20 1110 01/07/20 1116 01/07/20 1824 01/08/20 0401 01/09/20 0353 01/09/20 1250 01/10/20 0659 01/11/20 0431  WBC 12.2*  --   --   --  14.6*  --   --  8.7 7.2  CREATININE 0.99   < >  --  0.86 0.87 0.96  --  0.67 0.71  LATICACIDVEN  --   --  2.1* 2.1*  --   --  1.3  --   --    < > = values in this interval not displayed.    Estimated Creatinine Clearance: 64.3 mL/min (by C-G formula based on SCr of 0.71 mg/dL).    Allergies  Allergen Reactions  . Gabapentin   . Pregabalin     Antimicrobials this admission: Vancomycin 1/25 >> 1/26 Zosyn 1/25 >> 1/26 Cefazolin 1/26 >>   Microbiology results: BCx 1/25 : NG x 4 days  Thank you for allowing pharmacy to be a part of this patient's care.  2/25, PharmD, BCPS Clinical Pharmacist 01/11/2020 9:37 AM

## 2020-01-11 NOTE — Discharge Summary (Signed)
Physician Discharge Summary  Glenn Barker ZOX:[id number] DOB: 1933-12-13 DOA: 01/07/2020  PCP: Glenn [facility], [facility]  Admit date: 01/07/2020 Discharge date: 01/11/2020  Admitted From: Glenn Barker, ALF Disposition:  Glenn Barker, ALF  Recommendations for Outpatient Follow-up:  1. Follow up with PCP in 1-2 weeks 2. Please obtain BMP/CBC in one week    Home Health: Yes, PT  Equipment/Devices: Rolling Walker 5" wheels, 3-in-1   Discharge Condition: Stable  CODE STATUS: DNR  Diet recommendation: Soft   Brief/Interim Summary:  Patient admitted 01/07/20 with sepsis secondary to right lower extremity cellulitis after presenting from [facility] with fever and altered mental status.  Initially treated with broad spectrum antibiotics.  CT scan of the abdomen and right upper quadrant ultrasound of the abdomen negative for intra-abdominal infection. Chest x-ray negative for pneumonia. Urine analysis negative. Right lower extremity cellulitis only source of infection identified.  Antibiotics de-escalated to Ancef.  Patient's cellulitis improved, erythema receded, warmth and tenderness improved.  Patient was evaluated by PT, and home health PT was recommended in addition to 3-in-1 and rolling walker.  Patient is stable for discharge today, returning to ALF.  To complete 3 remaining days of Keflex for cellulitis.   Discharge Diagnoses: Principal Problem:   Cellulitis of right lower extremity Active Problems:   Sepsis (HCC)   Lactic acidosis   Acute metabolic encephalopathy   Dementia with behavioral disturbance [facility])    Discharge Instructions   Discharge Instructions    Call MD for:  extreme fatigue   Complete by: As directed    Call MD for:  severe uncontrolled pain   Complete by: As directed    Call MD for:  temperature >100.4   Complete by: As directed    Diet - low sodium heart healthy   Complete by: As directed    Discharge instructions   Complete by: As directed    Take  Keflex (antibiotic) for 3 more days for your skin infection of the right leg/foot.   Increase activity slowly   Complete by: As directed      Allergies as of 01/11/2020      Reactions   Gabapentin    Pregabalin       Medication List    TAKE these medications   acetaminophen 500 MG tablet Commonly known as: TYLENOL Take 1,000 mg by mouth 3 (three) times daily.   amLODipine 10 MG tablet Commonly known as: NORVASC Take 10 mg by mouth daily.   aspirin EC 81 MG tablet Take 81 mg by mouth every evening.   atorvastatin 40 MG tablet Commonly known as: LIPITOR Take 1 tablet (40 mg total) by mouth daily at 6 PM. What changed: when to take this   bacitracin-polymyxin b ointment Commonly known as: POLYSPORIN Apply topically daily. Left shin   cephALEXin 500 MG capsule Commonly known as: KEFLEX Take 1 capsule (500 mg total) by mouth 4 (four) times daily for 3 days.   CHAPSTICK EX Apply topically 3 (three) times daily as needed.   cholecalciferol 25 MCG (1000 UNIT) tablet Commonly known as: VITAMIN D3 Take 2,000 Units by mouth daily.   clopidogrel 75 MG tablet Commonly known as: PLAVIX Take 75 mg by mouth daily.   ICY HOT NO-MESS VAPOR GEL EX Apply 1 application topically 3 (three) times daily.   lisinopril 20 MG tablet Commonly known as: ZESTRIL Take 20 mg by mouth daily.   metoprolol succinate 50 MG 24 hr tablet Commonly known as: TOPROL-XL Take 50 mg by mouth  every evening.   potassium chloride SA 20 MEQ tablet Commonly known as: KLOR-CON Take 20 mEq by mouth 2 (two) times daily.   PRESCRIPTION MEDICATION See admin instructions. Lorazepam 0.5mg / 0.45ml gel= 0.108ml every 4 hours as needed for agitation   QUEtiapine 25 MG tablet Commonly known as: SEROQUEL Take 1 tablet (25 mg total) by mouth 2 (two) times daily. What changed: how much to take   risperiDONE 0.5 MG tablet Commonly known as: RISPERDAL Take 0.5 mg by mouth 3 (three) times daily.   traMADol 50  MG tablet Commonly known as: ULTRAM Take 50 mg by mouth 2 (two) times daily.   traZODone 50 MG tablet Commonly known as: DESYREL Take 1 tablet (50 mg total) by mouth at bedtime. What changed:   how much to take  when to take this   venlafaxine 75 MG tablet Commonly known as: EFFEXOR Take 1 tablet (75 mg total) by mouth 2 (two) times daily with a meal. What changed: when to take this   vitamin B-12 1000 MCG tablet Commonly known as: CYANOCOBALAMIN Take 1,000 mcg by mouth daily.            Durable Medical Equipment  (From admission, onward)         Start     Ordered   01/11/20 0726  DME 3-in-1  Once     01/11/20 0726   01/11/20 0726  DME Walker  Once    Question Answer Comment  Walker: With 5 Inch Wheels   Patient needs a walker to treat with the following condition Unsteady gait      01/11/20 0726          Allergies  Allergen Reactions  . Gabapentin   . Pregabalin     Consultations:  None    Procedures/Studies: DG Chest 2 View  Result Date: 01/07/2020 CLINICAL DATA:  Fever. EXAM: CHEST - 2 VIEW COMPARISON:  09/12/2019 FINDINGS: Lungs are hyperexpanded. The lungs are clear without focal pneumonia, edema, pneumothorax or pleural effusion. Cardiopericardial silhouette is at upper limits of normal for size. Bones are diffusely demineralized. IMPRESSION: Hyperexpansion without acute cardiopulmonary findings. Electronically Signed   By: Kennith Center M.D.   On: 01/07/2020 10:45   CT Head Wo Contrast  Result Date: 01/07/2020 CLINICAL DATA:  86 year old male with altered mental status. EXAM: CT HEAD WITHOUT CONTRAST TECHNIQUE: Contiguous axial images were obtained from the base of the skull through the vertex without intravenous contrast. COMPARISON:  08/05/2019 FINDINGS: Brain: No evidence of acute infarction, hemorrhage, hydrocephalus, extra-axial collection or mass lesion/mass effect. Atrophy and mild chronic small-vessel white matter ischemic changes.  Vascular: Carotid atherosclerotic calcifications noted. Skull: Normal. Negative for fracture or focal lesion. Sinuses/Orbits: No acute finding. Other: None. IMPRESSION: 1. No evidence of acute intracranial abnormality. 2. Atrophy and chronic small-vessel white matter ischemic changes. Electronically Signed   By: Harmon Pier M.D.   On: 01/07/2020 17:13   CT Abdomen Pelvis W Contrast  Result Date: 01/07/2020 CLINICAL DATA:  86 year old male with fever and abdominal pain. EXAM: CT ABDOMEN AND PELVIS WITH CONTRAST TECHNIQUE: Multidetector CT imaging of the abdomen and pelvis was performed using the standard protocol following bolus administration of intravenous contrast. CONTRAST:  OMNIPAQUE IOHEXOL 300 MG/ML  SOLN COMPARISON:  None. FINDINGS: Lower chest: LEFT basilar scarring/atelectasis noted. Hepatobiliary: The liver and gallbladder are unremarkable. No biliary dilatation. Pancreas: Unremarkable Spleen: Unremarkable Adrenals/Urinary Tract: 2 punctate nonobstructing RIGHT LOWER pole renal calculi are identified. A RIGHT LOWER pole renal cyst  is present. No obstructing urinary calculi noted. Mild circumferential bladder wall thickening is present. The adrenal glands are unremarkable. Stomach/Bowel: Stomach is within normal limits. No evidence of bowel wall thickening, distention, or inflammatory changes. Colonic diverticulosis noted without evidence of acute diverticulitis. Vascular/Lymphatic: Aortic atherosclerosis. No enlarged abdominal or pelvic lymph nodes. Reproductive: Mild prostate enlargement Other: No ascites, focal collection or pneumoperitoneum Musculoskeletal: No acute or significant osseous findings. Degenerative changes within the lumbar spine identified IMPRESSION: 1. No evidence of acute abnormality. 2. Colonic diverticulosis without evidence of acute diverticulitis 3. Nonobstructing RIGHT LOWER pole renal calculi. 4. 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 Correlate clinically. 5.  Aortic Atherosclerosis (ICD10-I70.0). Electronically Signed   By: Harmon Pier M.D.   On: 01/07/2020 17:05   DG Foot Complete Right  Result Date: 01/07/2020 CLINICAL DATA:  Foot pain and swelling. Lateral foot redness. EXAM: RIGHT FOOT COMPLETE - 3+ VIEW COMPARISON:  None. FINDINGS: Soft tissue swelling is noted along the lateral and dorsal aspects of the foot. No acute fracture, dislocation, osseous erosion, radiopaque foreign body, or soft tissue emphysema is identified. IMPRESSION: Soft tissue swelling without acute osseous abnormality. Electronically Signed   By: Sebastian Ache M.D.   On: 01/07/2020 16:28   US Abdomen Limited RUQ  Result Date: 01/08/2020 CLINICAL DATA:  Abdominal pain. EXAM: ULTRASOUND ABDOMEN LIMITED RIGHT UPPER QUADRANT COMPARISON:  CT abdomen and pelvis 01/07/2020 FINDINGS: Gallbladder: No gallstones or wall thickening visualized. No sonographic Murphy sign noted by sonographer. Common bile duct: Diameter: 6 mm Liver: No focal lesion identified. Within normal limits in parenchymal echogenicity. Portal vein is patent on color Doppler imaging with normal direction of blood flow towards the liver. Other: None. IMPRESSION: Unremarkable right upper quadrant ultrasound. Electronically Signed   By: Sebastian Ache M.D.   On: 01/08/2020 08:15       Subjective: Patient seen this AM.  States feeling well.  No acute events reported.  Patient has less tenderness on the right foot and leg. Denies fever/chills or other complaints.   Discharge Exam: Vitals:   01/10/20 2319 01/11/20 0510  BP: 132/69   Pulse: 60 (!) 54  Resp: 16 16  Temp: 97.7 F (36.5 C)   SpO2: 96% 97%   Vitals:   01/10/20 1555 01/10/20 2001 01/10/20 2319 01/11/20 0510  BP: (!) 142/93 101/67 132/69   Pulse: 75 62 60 (!) 54  Resp: 18 16 16 16   Temp: 98.6 F (37 C) 98.8 F (37.1 C) 97.7 F (36.5 C)   TempSrc: Oral Oral Oral   SpO2: 95% 96% 96% 97%   Weight:      Height:        General: Pt is alert, awake, not in acute distress Cardiovascular: RR, S1/S2 +, no rubs, no gallops Respiratory: CTA bilaterally, no wheezing, no rhonchi Abdominal: Soft, NT, ND, bowel sounds + Extremities: no edema, no cyanosis, right lower extremity with minimal remaining erythema on dorsal aspect of foot and improved tenderness    The results of significant diagnostics from this hospitalization (including imaging, microbiology, ancillary and laboratory) are listed below for reference.     Microbiology: Recent Results (from the past 240 hour(s))  Respiratory Panel by RT PCR (Flu A&B, Covid) -     Status: None   Collection Time: 01/07/20  2:25 PM  Result Value Ref Range Status   SARS Coronavirus 2 by RT PCR NEGATIVE NEGATIVE Final    Comment: (NOTE) SARS-CoV-2 target nucleic acids  are NOT DETECTED. The SARS-CoV-2 RNA is generally detectable in upper respiratoy specimens during the acute phase of infection. The lowest concentration of SARS-CoV-2 viral copies this assay can detect is 131 copies/mL. A negative result does not preclude SARS-Cov-2 infection and should not be used as the sole basis for treatment or other patient management decisions. A negative result may occur with  improper specimen collection/handling, submission of specimen other than nasopharyngeal swab, presence of viral mutation(s) within the areas targeted by this assay, and inadequate number of viral copies (<131 copies/mL). A negative result must be combined with clinical observations, patient history, and epidemiological information. The expected result is Negative. Fact Sheet for Patients:  https://www.moore.com/https://www.fda.gov/media/142436/download Fact Sheet for Healthcare Providers:  https://www.young.biz/https://www.fda.gov/media/142435/download This test is not yet ap proved or cleared by the [place] FDA and  has been authorized for detection and/or diagnosis of SARS-CoV-2 by FDA under an Emergency  Use Authorization (EUA). This EUA will remain  in effect (meaning this test can be used) for the duration of the COVID-19 declaration under Section 564(b)(1) of the Act, 21 U.S.C. section 360bbb-3(b)(1), unless the authorization is terminated or revoked sooner.    Influenza A by PCR NEGATIVE NEGATIVE Final   Influenza B by PCR NEGATIVE NEGATIVE Final    Comment: (NOTE) The Xpert Xpress SARS-CoV-2/FLU/RSV assay is intended as an aid in  the diagnosis of influenza from Nasopharyngeal swab specimens and  should not be used as a sole basis for treatment. Nasal washings and  aspirates are unacceptable for Xpert Xpress SARS-CoV-2/FLU/RSV  testing. Fact Sheet for Patients: https://www.moore.com/https://www.fda.gov/media/142436/download Fact Sheet for Healthcare Providers: https://www.young.biz/https://www.fda.gov/media/142435/download This test is not yet approved or cleared by the [place] FDA and  has been authorized for detection and/or diagnosis of SARS-CoV-2 by  FDA under an Emergency Use Authorization (EUA). This EUA will remain  in effect (meaning this test can be used) for the duration of the  Covid-19 declaration under Section 564(b)(1) of the Act, 21  U.S.C. section 360bbb-3(b)(1), unless the authorization is  terminated or revoked. Performed at [facility], [address] [place], [place] [address]   Culture, blood (routine x 2)     Status: None (Preliminary result)   Collection Time: 01/07/20  2:25 PM   Specimen: BLOOD  Result Value Ref Range Status   Specimen Description   Final    BLOOD Blood Culture results may not be optimal due to an inadequate volume of blood received in culture bottles   Special Requests   Final    BOTTLES DRAWN AEROBIC AND ANAEROBIC BLOOD RIGHT ARM   Culture   Final    NO GROWTH 4 DAYS Performed at [facility], [address] [place], [place] [address]    Report Status PENDING  Incomplete  Culture, blood (routine x 2)     Status: None (Preliminary result)    Collection Time: 01/07/20 11:45 PM   Specimen: BLOOD  Result Value Ref Range Status   Specimen Description BLOOD BLOOD LEFT WRIST  Final   Special Requests   Final    BOTTLES DRAWN AEROBIC AND ANAEROBIC Blood Culture adequate volume   Culture   Final    NO GROWTH 4 DAYS Performed at [facility], [address] [place], [place] [address]    Report Status PENDING  Incomplete  MRSA PCR Screening     Status: None   Collection Time: 01/08/20  5:27 AM   Specimen: Nasopharyngeal  Result Value Ref Range Status   MRSA  by PCR NEGATIVE NEGATIVE Final    Comment:        The GeneXpert MRSA Assay (FDA approved for NASAL specimens only), is one component of a comprehensive MRSA colonization surveillance program. It is not intended to diagnose MRSA infection nor to guide or monitor treatment for MRSA infections. Performed at [facility], [address] [place], [place] [address]      Labs: BNP (last 3 results) No results for input(s): BNP in the last 8760 hours. Basic Metabolic Panel: Recent Labs  Lab 01/07/20 1110 01/07/20 1110 01/07/20 1824 01/08/20 0401 01/09/20 0353 01/10/20 0659 01/11/20 0431  NA 137  --   --  140 143 139 141  K 4.1  --   --  3.2* 3.2* 3.3* 3.5  CL 100  --   --  101 105 104 107  CO2 30  --   --  30 30 27 27   GLUCOSE 140*  --   --  119* 107* 116* 113*  BUN 18  --   --  18 20 10 14   CREATININE 0.99   < > 0.86 0.87 0.96 0.67 0.71  CALCIUM 9.4  --   --  9.0 8.8* 8.6* 8.5*  MG  --   --   --   --  2.1 2.2 2.0   < > = values in this interval not displayed.   Liver Function Tests: Recent Labs  Lab 01/07/20 1110  AST 21  ALT 12  ALKPHOS 50  BILITOT 0.6  PROT 6.9  ALBUMIN 3.7   Recent Labs  Lab 01/07/20 1110  LIPASE 15   No results for input(s): AMMONIA in the last 168 hours. CBC: Recent Labs  Lab 01/07/20 1110 01/08/20 0401 01/10/20 0659 01/11/20 0431  WBC 12.2* 14.6* 8.7 7.2  NEUTROABS 11.0*  --  7.0 5.1  HGB 12.5*  11.6* 11.9* 11.1*  HCT 37.7* 36.6* 35.7* 33.7*  MCV 100.0 102.2* 97.3 98.5  PLT 227 184 221 234   Cardiac Enzymes: No results for input(s): CKTOTAL, CKMB, CKMBINDEX, TROPONINI in the last 168 hours. BNP: Invalid input(s): POCBNP CBG: No results for input(s): GLUCAP in the last 168 hours. D-Dimer No results for input(s): DDIMER in the last 72 hours. Hgb A1c No results for input(s): HGBA1C in the last 72 hours. Lipid Profile No results for input(s): CHOL, HDL, LDLCALC, TRIG, CHOLHDL, LDLDIRECT in the last 72 hours. Thyroid function studies No results for input(s): TSH, T4TOTAL, T3FREE, THYROIDAB in the last 72 hours.  Invalid input(s): FREET3 Anemia work up No results for input(s): VITAMINB12, FOLATE, FERRITIN, TIBC, IRON, RETICCTPCT in the last 72 hours. Urinalysis    Component Value Date/Time   COLORURINE YELLOW (A) 01/07/2020 1425   APPEARANCEUR CLEAR (A) 01/07/2020 1425   LABSPEC 1.021 01/07/2020 1425   PHURINE 5.0 01/07/2020 1425   GLUCOSEU NEGATIVE 01/07/2020 1425   HGBUR MODERATE (A) 01/07/2020 1425   BILIRUBINUR NEGATIVE 01/07/2020 1425   KETONESUR NEGATIVE 01/07/2020 1425   PROTEINUR 30 (A) 01/07/2020 1425   NITRITE NEGATIVE 01/07/2020 1425   LEUKOCYTESUR NEGATIVE 01/07/2020 1425   Sepsis Labs Invalid input(s): PROCALCITONIN,  WBC,  LACTICIDVEN Microbiology Recent Results (from the past 240 hour(s))  Respiratory Panel by RT PCR (Flu A&B, Covid) -     Status: None   Collection Time: 01/07/20  2:25 PM  Result Value Ref Range Status   SARS Coronavirus 2 by RT PCR NEGATIVE NEGATIVE Final    Comment: (NOTE) SARS-CoV-2 target nucleic acids are NOT DETECTED.  The SARS-CoV-2 RNA is generally detectable in upper respiratoy specimens during the acute phase of infection. The lowest concentration of SARS-CoV-2 viral copies this assay can detect is 131 copies/mL. A negative result does not preclude SARS-Cov-2 infection and should not be used as the sole basis for  treatment or other patient management decisions. A negative result may occur with  improper specimen collection/handling, submission of specimen other than nasopharyngeal swab, presence of viral mutation(s) within the areas targeted by this assay, and inadequate number of viral copies (<131 copies/mL). A negative result must be combined with clinical observations, patient history, and epidemiological information. The expected result is Negative. Fact Sheet for Patients:  PinkCheek.be Fact Sheet for Healthcare Providers:  GravelBags.it This test is not yet ap proved or cleared by the [place] FDA and  has been authorized for detection and/or diagnosis of SARS-CoV-2 by FDA under an Emergency Use Authorization (EUA). This EUA will remain  in effect (meaning this test can be used) for the duration of the COVID-19 declaration under Section 564(b)(1) of the Act, 21 U.S.C. section 360bbb-3(b)(1), unless the authorization is terminated or revoked sooner.    Influenza A by PCR NEGATIVE NEGATIVE Final   Influenza B by PCR NEGATIVE NEGATIVE Final    Comment: (NOTE) The Xpert Xpress SARS-CoV-2/FLU/RSV assay is intended as an aid in  the diagnosis of influenza from Nasopharyngeal swab specimens and  should not be used as a sole basis for treatment. Nasal washings and  aspirates are unacceptable for Xpert Xpress SARS-CoV-2/FLU/RSV  testing. Fact Sheet for Patients: PinkCheek.be Fact Sheet for Healthcare Providers: GravelBags.it This test is not yet approved or cleared by the [place] FDA and  has been authorized for detection and/or diagnosis of SARS-CoV-2 by  FDA under an Emergency Use Authorization (EUA). This EUA will remain  in effect (meaning this test can be used) for the duration of the  Covid-19 declaration under Section 564(b)(1) of the Act, 21  U.S.C. section  360bbb-3(b)(1), unless the authorization is  terminated or revoked. Performed at [facility], [address] [place], [place] [address]   Culture, blood (routine x 2)     Status: None (Preliminary result)   Collection Time: 01/07/20  2:25 PM   Specimen: BLOOD  Result Value Ref Range Status   Specimen Description   Final    BLOOD Blood Culture results may not be optimal due to an inadequate volume of blood received in culture bottles   Special Requests   Final    BOTTLES DRAWN AEROBIC AND ANAEROBIC BLOOD RIGHT ARM   Culture   Final    NO GROWTH 4 DAYS Performed at [facility], [address], [place], [place] [address]    Report Status PENDING  Incomplete  Culture, blood (routine x 2)     Status: None (Preliminary result)   Collection Time: 01/07/20 11:45 PM   Specimen: BLOOD  Result Value Ref Range Status   Specimen Description BLOOD BLOOD LEFT WRIST  Final   Special Requests   Final    BOTTLES DRAWN AEROBIC AND ANAEROBIC Blood Culture adequate volume   Culture   Final    NO GROWTH 4 DAYS Performed at [facility], [address] [place], [place] [address]    Report Status PENDING  Incomplete  MRSA PCR Screening     Status: None   Collection Time: 01/08/20  5:27 AM   Specimen: Nasopharyngeal  Result Value Ref Range Status   MRSA by PCR NEGATIVE  NEGATIVE Final    Comment:        The GeneXpert MRSA Assay (FDA approved for NASAL specimens only), is one component of a comprehensive MRSA colonization surveillance program. It is not intended to diagnose MRSA infection nor to guide or monitor treatment for MRSA infections. Performed at [facility], [address] [place], [place] [address]      Time coordinating discharge: Over 30 minutes  SIGNED:   Pennie Banter, DO [facility] 01/11/2020, 7:26 AM   If 7PM-7AM, please contact night-coverage [email or web address]

## 2020-01-11 NOTE — Progress Notes (Signed)
Slept well. No urine output. Placed urinal and encouraged void, no urine. Bladder scan, 476, will monitor.

## 2020-01-12 LAB — CULTURE, BLOOD (ROUTINE X 2)
Culture: NO GROWTH
Culture: NO GROWTH
Special Requests: ADEQUATE

## 2020-03-09 ENCOUNTER — Emergency Department: Payer: Medicare Other

## 2020-03-09 ENCOUNTER — Inpatient Hospital Stay
Admission: EM | Admit: 2020-03-09 | Discharge: 2020-03-12 | DRG: 872 | Disposition: A | Discharge status: Skilled Nursing Facility | Payer: Medicare Other | Source: Skilled Nursing Facility | Attending: Internal Medicine | Admitting: Internal Medicine

## 2020-03-09 ENCOUNTER — Other Ambulatory Visit: Payer: Self-pay

## 2020-03-09 DIAGNOSIS — A419 Sepsis, unspecified organism: Secondary | ICD-10-CM | POA: Diagnosis present

## 2020-03-09 DIAGNOSIS — E785 Hyperlipidemia, unspecified: Secondary | ICD-10-CM

## 2020-03-09 DIAGNOSIS — F0391 Unspecified dementia with behavioral disturbance: Secondary | ICD-10-CM | POA: Diagnosis present

## 2020-03-09 DIAGNOSIS — Z79899 Other long term (current) drug therapy: Secondary | ICD-10-CM

## 2020-03-09 DIAGNOSIS — R296 Repeated falls: Secondary | ICD-10-CM | POA: Diagnosis not present

## 2020-03-09 DIAGNOSIS — I10 Essential (primary) hypertension: Secondary | ICD-10-CM

## 2020-03-09 DIAGNOSIS — E872 Acidosis: Secondary | ICD-10-CM | POA: Diagnosis present

## 2020-03-09 DIAGNOSIS — R509 Fever, unspecified: Secondary | ICD-10-CM

## 2020-03-09 DIAGNOSIS — F329 Major depressive disorder, single episode, unspecified: Secondary | ICD-10-CM | POA: Diagnosis present

## 2020-03-09 DIAGNOSIS — E538 Deficiency of other specified B group vitamins: Secondary | ICD-10-CM | POA: Diagnosis present

## 2020-03-09 DIAGNOSIS — Y92129 Unspecified place in nursing home as the place of occurrence of the external cause: Secondary | ICD-10-CM

## 2020-03-09 DIAGNOSIS — Z20822 Contact with and (suspected) exposure to covid-19: Secondary | ICD-10-CM | POA: Diagnosis present

## 2020-03-09 DIAGNOSIS — M542 Cervicalgia: Secondary | ICD-10-CM

## 2020-03-09 DIAGNOSIS — Z66 Do not resuscitate: Secondary | ICD-10-CM | POA: Diagnosis present

## 2020-03-09 DIAGNOSIS — I4891 Unspecified atrial fibrillation: Secondary | ICD-10-CM | POA: Diagnosis present

## 2020-03-09 DIAGNOSIS — Z7982 Long term (current) use of aspirin: Secondary | ICD-10-CM

## 2020-03-09 DIAGNOSIS — Z8249 Family history of ischemic heart disease and other diseases of the circulatory system: Secondary | ICD-10-CM

## 2020-03-09 DIAGNOSIS — W19XXXA Unspecified fall, initial encounter: Secondary | ICD-10-CM | POA: Diagnosis present

## 2020-03-09 DIAGNOSIS — Z7902 Long term (current) use of antithrombotics/antiplatelets: Secondary | ICD-10-CM | POA: Diagnosis not present

## 2020-03-09 DIAGNOSIS — E876 Hypokalemia: Secondary | ICD-10-CM | POA: Diagnosis present

## 2020-03-09 DIAGNOSIS — I2510 Atherosclerotic heart disease of native coronary artery without angina pectoris: Secondary | ICD-10-CM

## 2020-03-09 DIAGNOSIS — Z888 Allergy status to other drugs, medicaments and biological substances status: Secondary | ICD-10-CM | POA: Diagnosis not present

## 2020-03-09 DIAGNOSIS — R6520 Severe sepsis without septic shock: Secondary | ICD-10-CM | POA: Diagnosis not present

## 2020-03-09 DIAGNOSIS — G9340 Encephalopathy, unspecified: Secondary | ICD-10-CM | POA: Diagnosis not present

## 2020-03-09 DIAGNOSIS — R410 Disorientation, unspecified: Secondary | ICD-10-CM

## 2020-03-09 LAB — COMPREHENSIVE METABOLIC PANEL
ALT: 12 U/L (ref 0–44)
AST: 20 U/L (ref 15–41)
Albumin: 3.5 g/dL (ref 3.5–5.0)
Alkaline Phosphatase: 52 U/L (ref 38–126)
Anion gap: 8 (ref 5–15)
BUN: 22 mg/dL (ref 8–23)
CO2: 26 mmol/L (ref 22–32)
Calcium: 8.6 mg/dL — ABNORMAL LOW (ref 8.9–10.3)
Chloride: 105 mmol/L (ref 98–111)
Creatinine, Ser: 0.96 mg/dL (ref 0.61–1.24)
GFR calc Af Amer: >60 mL/min (ref >60)
GFR calc non Af Amer: >60 mL/min (ref >60)
Glucose, Bld: 127 mg/dL — ABNORMAL HIGH (ref 70–99)
Potassium: 4.1 mmol/L (ref 3.5–5.1)
Sodium: 139 mmol/L (ref 135–145)
Total Bilirubin: 0.8 mg/dL (ref 0.3–1.2)
Total Protein: 6 g/dL — ABNORMAL LOW (ref 6.5–8.1)

## 2020-03-09 LAB — URINALYSIS, ROUTINE W REFLEX MICROSCOPIC
Bilirubin Urine: NEGATIVE
Glucose, UA: NEGATIVE mg/dL
Hgb urine dipstick: NEGATIVE
Ketones, ur: NEGATIVE mg/dL
Leukocytes,Ua: NEGATIVE
Nitrite: NEGATIVE
Protein, ur: NEGATIVE mg/dL
Specific Gravity, Urine: 1.025 (ref 1.005–1.030)
pH: 5 (ref 5.0–8.0)

## 2020-03-09 LAB — CBC WITH DIFFERENTIAL/PLATELET
Abs Immature Granulocytes: 0.02 10*3/uL (ref 0.00–0.07)
Basophils Absolute: 0 10*3/uL (ref 0.0–0.1)
Basophils Relative: 0 %
Eosinophils Absolute: 0 10*3/uL (ref 0.0–0.5)
Eosinophils Relative: 0 %
HCT: 35.6 % — ABNORMAL LOW (ref 39.0–52.0)
Hemoglobin: 11.5 g/dL — ABNORMAL LOW (ref 13.0–17.0)
Immature Granulocytes: 0 %
Lymphocytes Relative: 5 %
Lymphs Abs: 0.4 10*3/uL — ABNORMAL LOW (ref 0.7–4.0)
MCH: 33.2 pg (ref 26.0–34.0)
MCHC: 32.3 g/dL (ref 30.0–36.0)
MCV: 102.9 fL — ABNORMAL HIGH (ref 80.0–100.0)
Monocytes Absolute: 0.4 10*3/uL (ref 0.1–1.0)
Monocytes Relative: 5 %
Neutro Abs: 6.6 10*3/uL (ref 1.7–7.7)
Neutrophils Relative %: 90 %
Platelets: 225 10*3/uL (ref 150–400)
RBC: 3.46 MIL/uL — ABNORMAL LOW (ref 4.22–5.81)
RDW: 13.7 % (ref 11.5–15.5)
WBC: 7.3 10*3/uL (ref 4.0–10.5)
nRBC: 0 % (ref 0.0–0.2)

## 2020-03-09 LAB — RESPIRATORY PANEL BY RT PCR (FLU A&B, COVID)
Influenza A by PCR: NEGATIVE
Influenza B by PCR: NEGATIVE
SARS Coronavirus 2 by RT PCR: NEGATIVE

## 2020-03-09 LAB — LACTIC ACID, PLASMA
Lactic Acid, Venous: 3 mmol/L (ref 0.5–1.9)
Lactic Acid, Venous: 1.4 mmol/L (ref 0.5–1.9)

## 2020-03-09 LAB — APTT: aPTT: 29 seconds (ref 24–36)

## 2020-03-09 LAB — PROTIME-INR
INR: 1 (ref 0.8–1.2)
Prothrombin Time: 13.2 seconds (ref 11.4–15.2)

## 2020-03-09 LAB — PROCALCITONIN: Procalcitonin: <0.1 ng/mL

## 2020-03-09 MED ORDER — ONDANSETRON HCL 4 MG/2ML IJ SOLN: 4.0000 mg | Freq: Four times a day (QID) | INTRAMUSCULAR | Status: DC | PRN

## 2020-03-09 MED ORDER — VANCOMYCIN HCL IN DEXTROSE 1-5 GM/200ML-% IV SOLN: 1000.0000 mg | Freq: Once | INTRAVENOUS | Status: DC

## 2020-03-09 MED ORDER — RISPERIDONE 0.5 MG PO TABS
0.5000 mg | ORAL_TABLET | Freq: Three times a day (TID) | ORAL | Status: DC
  Administered 2020-03-09 – 2020-03-12 (×6): 0.5 mg via ORAL
  Filled 2020-03-09 (×10): qty 1

## 2020-03-09 MED ORDER — ENOXAPARIN SODIUM 40 MG/0.4ML ~~LOC~~ SOLN
40.0000 mg | SUBCUTANEOUS | Status: DC
  Administered 2020-03-09 – 2020-03-11 (×2): 40 mg via SUBCUTANEOUS
  Filled 2020-03-09 (×3): qty 0.4

## 2020-03-09 MED ORDER — VITAMIN D 25 MCG (1000 UNIT) PO TABS
2000.0000 [IU] | ORAL_TABLET | Freq: Every day | ORAL | Status: DC
  Administered 2020-03-10 – 2020-03-12 (×3): 2000 [IU] via ORAL
  Filled 2020-03-09 (×3): qty 2

## 2020-03-09 MED ORDER — VANCOMYCIN HCL 1500 MG/300ML IV SOLN
1500.0000 mg | INTRAVENOUS | Status: DC
  Administered 2020-03-10: 1500 mg via INTRAVENOUS
  Filled 2020-03-09 (×3): qty 300

## 2020-03-09 MED ORDER — TRAZODONE HCL 50 MG PO TABS
75.0000 mg | ORAL_TABLET | Freq: Every day | ORAL | Status: DC
  Administered 2020-03-09 – 2020-03-11 (×2): 75 mg via ORAL
  Filled 2020-03-09 (×3): qty 2

## 2020-03-09 MED ORDER — ICY HOT NO-MESS VAPOR GEL 5.3-1.3-2.8 % EX GEL: Freq: Three times a day (TID) | CUTANEOUS | Status: DC

## 2020-03-09 MED ORDER — SODIUM CHLORIDE 0.9 % IV SOLN
2.0000 g | Freq: Two times a day (BID) | INTRAVENOUS | Status: DC
  Administered 2020-03-10: 08:00:00 2 g via INTRAVENOUS
  Filled 2020-03-09 (×2): qty 2

## 2020-03-09 MED ORDER — LISINOPRIL 20 MG PO TABS
20.0000 mg | ORAL_TABLET | Freq: Every day | ORAL | Status: DC
  Administered 2020-03-09 – 2020-03-12 (×4): 20 mg via ORAL
  Filled 2020-03-09: qty 1
  Filled 2020-03-09 (×2): qty 2
  Filled 2020-03-09: qty 1

## 2020-03-09 MED ORDER — ASPIRIN EC 81 MG PO TBEC
81.0000 mg | DELAYED_RELEASE_TABLET | Freq: Every evening | ORAL | Status: DC
  Administered 2020-03-09 – 2020-03-10 (×2): 81 mg via ORAL
  Filled 2020-03-09 (×3): qty 1

## 2020-03-09 MED ORDER — SODIUM CHLORIDE 0.9 % IV SOLN: 2.0000 g | Freq: Once | INTRAVENOUS | Status: DC

## 2020-03-09 MED ORDER — METRONIDAZOLE IN NACL 5-0.79 MG/ML-% IV SOLN
500.0000 mg | Freq: Three times a day (TID) | INTRAVENOUS | Status: DC
  Administered 2020-03-10 – 2020-03-11 (×5): 500 mg via INTRAVENOUS
  Filled 2020-03-09 (×6): qty 100

## 2020-03-09 MED ORDER — TRIPLE ANTIBIOTIC 3.5-400-5000 EX OINT
1.0000 "application " | TOPICAL_OINTMENT | Freq: Every day | CUTANEOUS | Status: DC
  Administered 2020-03-09 – 2020-03-12 (×4): 1 via TOPICAL
  Filled 2020-03-09 (×2): qty 1

## 2020-03-09 MED ORDER — SODIUM CHLORIDE 0.9 % IV SOLN: 2.0000 g | Freq: Two times a day (BID) | INTRAVENOUS | Status: DC

## 2020-03-09 MED ORDER — ACETAMINOPHEN 325 MG PO TABS: 650.0000 mg | ORAL_TABLET | Freq: Four times a day (QID) | ORAL | Status: DC | PRN

## 2020-03-09 MED ORDER — VITAMIN B-12 1000 MCG PO TABS
1000.0000 ug | ORAL_TABLET | Freq: Every day | ORAL | Status: DC
  Administered 2020-03-10 – 2020-03-12 (×3): 1000 ug via ORAL
  Filled 2020-03-09 (×3): qty 1

## 2020-03-09 MED ORDER — TRAZODONE HCL 50 MG PO TABS: 25.0000 mg | ORAL_TABLET | Freq: Every evening | ORAL | Status: DC | PRN

## 2020-03-09 MED ORDER — TRAMADOL HCL 50 MG PO TABS: 50.0000 mg | ORAL_TABLET | Freq: Two times a day (BID) | ORAL | Status: DC | PRN

## 2020-03-09 MED ORDER — POTASSIUM CHLORIDE CRYS ER 20 MEQ PO TBCR: 20.0000 meq | EXTENDED_RELEASE_TABLET | Freq: Two times a day (BID) | ORAL | Status: DC

## 2020-03-09 MED ORDER — BLISTEX MEDICATED EX OINT
TOPICAL_OINTMENT | Freq: Three times a day (TID) | CUTANEOUS | Status: DC | PRN
  Filled 2020-03-09: qty 6.3

## 2020-03-09 MED ORDER — VENLAFAXINE HCL 37.5 MG PO TABS
75.0000 mg | ORAL_TABLET | Freq: Two times a day (BID) | ORAL | Status: DC
  Administered 2020-03-10 – 2020-03-12 (×4): 75 mg via ORAL
  Filled 2020-03-09 (×7): qty 2

## 2020-03-09 MED ORDER — SODIUM CHLORIDE 0.9 % IV SOLN
2.0000 g | Freq: Once | INTRAVENOUS | Status: AC
  Administered 2020-03-09: 2 g via INTRAVENOUS
  Filled 2020-03-09: qty 2

## 2020-03-09 MED ORDER — METOPROLOL SUCCINATE ER 50 MG PO TB24
50.0000 mg | ORAL_TABLET | Freq: Every evening | ORAL | Status: DC
  Administered 2020-03-09 – 2020-03-10 (×2): 50 mg via ORAL
  Filled 2020-03-09 (×3): qty 1

## 2020-03-09 MED ORDER — MAGNESIUM HYDROXIDE 400 MG/5ML PO SUSP
30.0000 mL | Freq: Every day | ORAL | Status: DC | PRN
  Filled 2020-03-09: qty 30

## 2020-03-09 MED ORDER — ATORVASTATIN CALCIUM 20 MG PO TABS
40.0000 mg | ORAL_TABLET | Freq: Every day | ORAL | Status: DC
  Administered 2020-03-10: 40 mg via ORAL
  Filled 2020-03-09 (×2): qty 2

## 2020-03-09 MED ORDER — AMLODIPINE BESYLATE 10 MG PO TABS
10.0000 mg | ORAL_TABLET | Freq: Every day | ORAL | Status: DC
  Administered 2020-03-09 – 2020-03-12 (×4): 10 mg via ORAL
  Filled 2020-03-09: qty 2
  Filled 2020-03-09: qty 1
  Filled 2020-03-09: qty 2
  Filled 2020-03-09: qty 1

## 2020-03-09 MED ORDER — METRONIDAZOLE IN NACL 5-0.79 MG/ML-% IV SOLN
500.0000 mg | Freq: Once | INTRAVENOUS | Status: AC
  Administered 2020-03-09: 19:00:00 500 mg via INTRAVENOUS
  Filled 2020-03-09: qty 100

## 2020-03-09 MED ORDER — ONDANSETRON HCL 4 MG PO TABS: 4.0000 mg | ORAL_TABLET | Freq: Four times a day (QID) | ORAL | Status: DC | PRN

## 2020-03-09 MED ORDER — VANCOMYCIN HCL IN DEXTROSE 1-5 GM/200ML-% IV SOLN
1000.0000 mg | Freq: Once | INTRAVENOUS | Status: AC
  Administered 2020-03-09: 1000 mg via INTRAVENOUS
  Filled 2020-03-09: qty 200

## 2020-03-09 MED ORDER — SODIUM CHLORIDE 0.9 % IV SOLN: INTRAVENOUS | Status: DC

## 2020-03-09 MED ORDER — VANCOMYCIN HCL 500 MG/100ML IV SOLN
500.0000 mg | Freq: Once | INTRAVENOUS | Status: AC
  Administered 2020-03-09: 500 mg via INTRAVENOUS
  Filled 2020-03-09: qty 100

## 2020-03-09 MED ORDER — ACETAMINOPHEN 650 MG RE SUPP: 650.0000 mg | Freq: Four times a day (QID) | RECTAL | Status: DC | PRN

## 2020-03-09 MED ORDER — CLOPIDOGREL BISULFATE 75 MG PO TABS
75.0000 mg | ORAL_TABLET | Freq: Every day | ORAL | Status: DC
  Administered 2020-03-10 – 2020-03-12 (×3): 75 mg via ORAL
  Filled 2020-03-09 (×3): qty 1

## 2020-03-09 NOTE — Progress Notes (Signed)
CODE SEPSIS - PHARMACY COMMUNICATION  **Broad Spectrum Antibiotics should be administered within 1 hour of Sepsis diagnosis**  Time Code Sepsis Called/Page Received:  3/28 @ 1750   Antibiotics Ordered: Vanc, Cefepime, Metronidazole   Time of 1st antibiotic administration: Cefepime 2 gm IV X 1 on 3/28 @ 1905   Additional action taken by pharmacy: Called around 1830 to remind MD to enter abx orders   If necessary, Name of Provider/Nurse Contacted: Dr Mahala Menghini D ,PharmD Clinical Pharmacist  03/09/2020  7:35 PM

## 2020-03-09 NOTE — ED Notes (Signed)
2 sets of blood cultures, lactic and full rainbow sent to lab.

## 2020-03-09 NOTE — ED Notes (Signed)
Wife at bedside.

## 2020-03-09 NOTE — H&P (Addendum)
[place] [facility] [place] NAME: Glenn Barker    MR#:  [id number]  DATE OF BIRTH:  06-23-1933  DATE OF ADMISSION:  03/09/2020  PRIMARY CARE PHYSICIAN: [facility]   REQUESTING/REFERRING PHYSICIAN: Lavonia Drafts, MD  CHIEF COMPLAINT:   Chief Complaint  Patient presents with  . Fall    HISTORY OF PRESENT ILLNESS:  Glenn Barker  is a 86 y.o. male with a known history of hypertension, dementia and coronary artery disease, who presented to the emergency room with acute onset of altered mental status which has been worsening today at [facility] assisted living facility in the memory care unit with unwitnessed fall today with subsequent right hand contusion and questionable head injury.  The patient admitted to headache before his fall and dizziness earlier.  He denied any nausea or vomiting or abdominal pain.  No reported chest pain or dyspnea or cough or wheezing.  No reported rhinorrhea or nasal congestion or sore throat or earache.  No reported dysuria, oliguria, urinary urgency or frequency or flank pain.  He admitted to occasional neck pain.  He does have bowel and bladder incontinence per his wife.  History is limited by the patient's dementia.  EMS reported temperature of 102.7.  He was not given Tylenol per his wife.  His blood glucose was 147.  Upon presentation to the emergency room temperature was 99 and later 99.7 here with blood pressure 148/80 and otherwise normal vital signs.  Labs revealed lactic acid of 3 and CBC showed mild anemia close to baseline.  Influenza antigens and COVID-19 PCR came back negative.  Urinalysis was unremarkable.  Blood and urine cultures were sent.  Chest x-ray showed minimal bibasal scarring versus atelectasis. EKG showed atrial fibrillation with controlled ventricular response of 79.  The patient was given 2 g of IV cefepime, 500 mg of IV Flagyl and 1 g of IV vancomycin for suspected sepsis with unknown source.  The  patient will be admitted to a medical monitored bed for further evaluation and management.  PAST MEDICAL HISTORY:   Past Medical History:  Diagnosis Date  . Coronary artery disease   . Dementia ([place])   . Hypertension     PAST SURGICAL HISTORY:   Past Surgical History:  Procedure Laterality Date  . NO PAST SURGERIES      SOCIAL HISTORY:   Social History   Tobacco Use  . Smoking status: Unknown If Ever Smoked  . Smokeless tobacco: Never Used  Substance Use Topics  . Alcohol use: Never    FAMILY HISTORY:   Family History  Problem Relation Age of Onset  . Hypertension Mother     DRUG ALLERGIES:   Allergies  Allergen Reactions  . Gabapentin   . Pregabalin     REVIEW OF SYSTEMS:   ROS As per history of present illness. All pertinent systems were reviewed above. Constitutional,  HEENT, cardiovascular, respiratory, GI, GU, musculoskeletal, neuro, psychiatric, endocrine,  integumentary and hematologic systems were reviewed and are otherwise  negative/unremarkable except for positive findings mentioned above in the HPI.   MEDICATIONS AT HOME:   Prior to Admission medications   Medication Sig Start Date End Date Taking? Authorizing Provider  acetaminophen (TYLENOL) 500 MG tablet Take 1,000 mg by mouth 3 (three) times daily.   Yes [provider]  amLODipine (NORVASC) 10 MG tablet Take 10 mg by mouth daily.   Yes [provider]  aspirin EC 81 MG tablet Take 81 mg  by mouth every evening.   Yes [provider]  atorvastatin (LIPITOR) 40 MG tablet Take 1 tablet (40 mg total) by mouth daily at 6 PM. 01/11/20  Yes Esaw Grandchild A, DO  bacitracin-polymyxin b (POLYSPORIN) ointment Apply 1 application topically daily. (apply to left shin)   Yes [provider]  Camphor-Eucalyptus-Menthol (ICY HOT NO-MESS VAPOR GEL EX) Apply 1 application topically 3 (three) times daily.   Yes [provider]  cholecalciferol (VITAMIN D3) 25 MCG  (1000 UT) tablet Take 2,000 Units by mouth daily.   Yes [provider]  clopidogrel (PLAVIX) 75 MG tablet Take 75 mg by mouth daily.   Yes [provider]  lisinopril (ZESTRIL) 20 MG tablet Take 20 mg by mouth daily.   Yes [provider]  metoprolol succinate (TOPROL-XL) 50 MG 24 hr tablet Take 50 mg by mouth every evening.   Yes [provider]  potassium chloride SA (K-DUR) 20 MEQ tablet Take 20 mEq by mouth 2 (two) times daily.   Yes [provider]  PRESCRIPTION MEDICATION Apply 0.5 mg topically every 4 (four) hours as needed (agitation). ** lorazepam 0.5mg / 0.32ml topical gel **   Yes [provider]  risperiDONE (RISPERDAL) 0.5 MG tablet Take 0.5 mg by mouth 3 (three) times daily.   Yes [provider]  Sunscreens (CHAPSTICK EX) Apply 1 application topically 3 (three) times daily as needed (chapped lips).    Yes [provider]  traMADol (ULTRAM) 50 MG tablet Take 50 mg by mouth every 12 (twelve) hours as needed for moderate pain.    Yes [provider]  traZODone (DESYREL) 50 MG tablet Take 1 tablet (50 mg total) by mouth at bedtime. Patient taking differently: Take 75 mg by mouth at bedtime.  01/11/20  Yes Pennie Banter, DO  venlafaxine (EFFEXOR) 75 MG tablet Take 1 tablet (75 mg total) by mouth 2 (two) times daily with a meal. 01/11/20  Yes Esaw Grandchild A, DO  vitamin B-12 (CYANOCOBALAMIN) 1000 MCG tablet Take 1,000 mcg by mouth daily.   Yes [provider]      VITAL SIGNS:  Blood pressure (!) 145/82, pulse 71, temperature 99.7 F (37.6 C), temperature source Oral, resp. rate 19, height 6' 2.5" (1.892 m), weight 69.7 kg, SpO2 99 %.  PHYSICAL EXAMINATION:  Physical Exam  GENERAL:  86 y.o.-year-old Caucasian male male patient lying in the bed with no acute distress.  EYES: Pupils equal, round, reactive to light and accommodation. No scleral icterus. Extraocular muscles intact.  HEENT: Head  atraumatic, normocephalic. Oropharynx and nasopharynx clear.  NECK:  Supple, no jugular venous distention. No thyroid enlargement, no tenderness.  LUNGS: Normal breath sounds bilaterally, no wheezing, rales,rhonchi or crepitation. No use of accessory muscles of respiration.  CARDIOVASCULAR: Regular rate and rhythm, S1, S2 normal.  1/6 to 2/6 systolic ejection murmur at the left lower sternal border with no gallops or rubs.  ABDOMEN: Soft, nondistended, nontender. Bowel sounds present. No organomegaly or mass.  EXTREMITIES: No pedal edema, cyanosis, or clubbing.  NEUROLOGIC: Cranial nerves II through XII are intact. Muscle strength 5/5 in all extremities. Sensation intact. Gait not checked.  He had positive Kernig sign and and Brudzinski's.  PSYCHIATRIC: The patient is alert and disoriented.  Normal affect and good eye contact. SKIN: No obvious rash, lesion, or ulcer.   LABORATORY PANEL:   CBC Recent Labs  Lab 03/09/20 1748  WBC 7.3  HGB 11.5*  HCT 35.6*  PLT 225   ------------------------------------------------------------------------------------------------------------------  Chemistries  Recent Labs  Lab 03/09/20 1748  NA 139  K 4.1  CL 105  CO2 26  GLUCOSE 127*  BUN 22  CREATININE 0.96  CALCIUM 8.6*  AST 20  ALT 12  ALKPHOS 52  BILITOT 0.8   ------------------------------------------------------------------------------------------------------------------  Cardiac Enzymes No results for input(s): TROPONINI in the last 168 hours. ------------------------------------------------------------------------------------------------------------------  RADIOLOGY:  CT Head Wo Contrast  Result Date: 03/09/2020 CLINICAL DATA:  Unwitnessed fall, head injury EXAM: CT HEAD WITHOUT CONTRAST TECHNIQUE: Contiguous axial images were obtained from the base of the skull through the vertex without intravenous contrast. COMPARISON:  01/07/2020 FINDINGS: Brain: No evidence of acute  infarction, hemorrhage, hydrocephalus, extra-axial collection or mass lesion/mass effect. Global cortical and central atrophy. Subcortical white matter and periventricular small vessel ischemic changes. Vascular: Intracranial atherosclerosis. Skull: Normal. Negative for fracture or focal lesion. Sinuses/Orbits: The visualized paranasal sinuses are essentially clear. The mastoid air cells are unopacified. Other: None. IMPRESSION: No evidence of acute intracranial abnormality. Atrophy with small vessel ischemic changes. Electronically Signed   By: Charline Bills M.D.   On: 03/09/2020 20:13   DG Chest Port 1 View  Result Date: 03/09/2020 CLINICAL DATA:  Dyspnea, fall EXAM: PORTABLE CHEST 1 VIEW COMPARISON:  01/07/2020 chest radiograph. FINDINGS: Stable cardiomediastinal silhouette with normal heart size. No pneumothorax. No pleural effusion. Minimal bibasilar scarring versus atelectasis. No pulmonary edema. No acute consolidative airspace disease. No displaced fractures. Surgical clips overlie the lower cervical spine, unchanged. IMPRESSION: Minimal bibasilar scarring versus atelectasis. Electronically Signed   By: Delbert Phenix M.D.   On: 03/09/2020 18:18      IMPRESSION AND PLAN:   1.  Sepsis of questionable source without severe sepsis or septic shock. -The patient will be admitted to a medical monitored bed. -We will continue broad-spectrum antibiotics with IV cefepime, vancomycin and Flagyl. -We will continue hydration with IV normal saline. -We will follow blood cultures. -We will follow lactic acid level. -Given positive meningeal signs though given the patient dementia I am not sure how reliable those are, the wife refused to have the patient have lumbar puncture. -We will obtain a 2D echo given soft systolic murmur.  2.  Hypertension. -Continue Zestril, Toprol-XL and amlodipine.  3.  Dyslipidemia. -Continue statin therapy.  4.  Coronary artery disease. -We will continue aspirin and  Plavix as well as beta-blocker therapy and ACE inhibitor.  5.  Depression and dementia with behavioral changes. -We will continue Risperdal and Effexor.  6.  Vitamin B12 deficiency. -We will continue vitamin B12.  7.  DVT prophylaxis. -Subcutaneous Lovenox.  All the records are reviewed and case discussed with ED provider. The plan of care was discussed in details with the patient (and family). I answered all questions. The patient agreed to proceed with the above mentioned plan. Further management will depend upon hospital course.   CODE STATUS: The patient is DNR/DNI.  TOTAL TIME TAKING CARE OF THIS PATIENT: 55 minutes.    Hannah Beat M.D on 03/09/2020 at 9:09 PM  [facility]   From 7 PM-7 AM, contact night-coverage [email or web address]  CC: Primary care physician; [facility]   Note: This dictation was prepared with Dragon dictation along with smaller phrase technology. Any transcriptional errors that result from this process are unintentional.

## 2020-03-09 NOTE — Progress Notes (Signed)
Pharmacy Antibiotic Note  Glenn Barker is a 86 y.o. male admitted on 03/09/2020 with sepsis.  Pharmacy has been consulted for Vancomycin, Cefepime  dosing.  Plan: Cefepime 2 gm IV X 1 given in ED on 3/28 @ 1900.  Cefepime 2 gm IV Q12H ordered to start on 3/29 @ 0700.  Vancomycin 1 gm IV X 1 given in ED on 3/28 @ 1900. Additional Vanc 500 mg IV X 1 ordered to be given to make total loading dose of 1500 mg. Vancomycin 1500 mg IV Q24H ordered to start on 3/29 @ 1900.  Vd = 50.2 L Ke = 0.06 hr-1 T1/2 = 11.5 hrs AUC = 494.5 Vanc trough = 10 mcg/mL   Height: 6' 2.5" (189.2 cm) Weight: 153 lb 9.6 oz (69.7 kg) IBW/kg (Calculated) : 83.35  Temp (24hrs), Avg:99.4 F (37.4 C), Min:99 F (37.2 C), Max:99.7 F (37.6 C)  Recent Labs  Lab 03/09/20 1748  WBC 7.3  CREATININE 0.96  LATICACIDVEN 3.0*    Estimated Creatinine Clearance: 56.5 mL/min (by C-G formula based on SCr of 0.96 mg/dL).    Allergies  Allergen Reactions  . Gabapentin   . Pregabalin     Antimicrobials this admission:   >>    >>   Dose adjustments this admission:   Microbiology results:  BCx:   UCx:    Sputum:    MRSA PCR:   Thank you for allowing pharmacy to be a part of this patient's care.  Virgie Chery D 03/09/2020 9:27 PM

## 2020-03-09 NOTE — ED Notes (Addendum)
Date and time results received: 03/09/20 18:43   Test: Lactic acid Critical Value: 3.0  Name of Provider Notified: Dr Cyril Loosen  See orders.

## 2020-03-09 NOTE — ED Notes (Signed)
Yellow socks and yellow fall bracelet and purple DNR bracelet placed on pt. Bed alarm placed for safety.

## 2020-03-09 NOTE — ED Notes (Signed)
Pt cleaned and male external cath placed on pt. Pt incontinent of urine and had small bm.

## 2020-03-09 NOTE — ED Provider Notes (Signed)
[facility] Emergency Department Provider Note   ____________________________________________    I have reviewed the triage vital signs and the nursing notes.   HISTORY  Chief Complaint Fall  History limited by dementia   HPI Glenn Barker is a 86 y.o. male who presents after a unwitnessed fall reportedly from facility.  The patient is a history of dementia is unable to provide any history.  Facility reports the patient has seemed more altered today than typical.  EMS initially reports axillary fever upon their arrival however the patient is afebrile here.  Past Medical History:  Diagnosis Date  . Coronary artery disease   . Dementia ([facility])   . Hypertension     Patient Active Problem List   Diagnosis Date Noted  . Acute metabolic encephalopathy 01/09/2020  . Lactic acidosis   . Sepsis ([facility]) 01/07/2020  . Cellulitis of right lower extremity   . RUQ pain   . Dementia with behavioral disturbance ([facility])   . Essential hypertension     Past Surgical History:  Procedure Laterality Date  . NO PAST SURGERIES      Prior to Admission medications   Medication Sig Start Date End Date Taking? Authorizing Provider  acetaminophen (TYLENOL) 500 MG tablet Take 1,000 mg by mouth 3 (three) times daily.   Yes [provider]  amLODipine (NORVASC) 10 MG tablet Take 10 mg by mouth daily.   Yes [provider]  aspirin EC 81 MG tablet Take 81 mg by mouth every evening.   Yes [provider]  atorvastatin (LIPITOR) 40 MG tablet Take 1 tablet (40 mg total) by mouth daily at 6 PM. 01/11/20  Yes Esaw Grandchild A, DO  bacitracin-polymyxin b (POLYSPORIN) ointment Apply 1 application topically daily. (apply to left shin)   Yes [provider]  Camphor-Eucalyptus-Menthol (ICY HOT NO-MESS VAPOR GEL EX) Apply 1 application topically 3 (three) times daily.   Yes [provider]  cholecalciferol (VITAMIN D3) 25 MCG (1000 UT) tablet  Take 2,000 Units by mouth daily.   Yes [provider]  clopidogrel (PLAVIX) 75 MG tablet Take 75 mg by mouth daily.   Yes [provider]  lisinopril (ZESTRIL) 20 MG tablet Take 20 mg by mouth daily.   Yes [provider]  metoprolol succinate (TOPROL-XL) 50 MG 24 hr tablet Take 50 mg by mouth every evening.   Yes [provider]  potassium chloride SA (K-DUR) 20 MEQ tablet Take 20 mEq by mouth 2 (two) times daily.   Yes [provider]  PRESCRIPTION MEDICATION Apply 0.5 mg topically every 4 (four) hours as needed (agitation). ** lorazepam 0.5mg / 0.30ml topical gel **   Yes [provider]  risperiDONE (RISPERDAL) 0.5 MG tablet Take 0.5 mg by mouth 3 (three) times daily.   Yes [provider]  Sunscreens (CHAPSTICK EX) Apply 1 application topically 3 (three) times daily as needed (chapped lips).    Yes [provider]  traMADol (ULTRAM) 50 MG tablet Take 50 mg by mouth every 12 (twelve) hours as needed for moderate pain.    Yes [provider]  traZODone (DESYREL) 50 MG tablet Take 1 tablet (50 mg total) by mouth at bedtime. Patient taking differently: Take 75 mg by mouth at bedtime.  01/11/20  Yes Pennie Banter, DO  venlafaxine (EFFEXOR) 75 MG tablet Take 1 tablet (75 mg total) by mouth 2 (two) times daily with a meal. 01/11/20  Yes Pennie Banter, DO  vitamin  B-12 (CYANOCOBALAMIN) 1000 MCG tablet Take 1,000 mcg by mouth daily.   Yes [provider]     Allergies Gabapentin and Pregabalin  Family History  Problem Relation Age of Onset  . Hypertension Mother     Social History Social History   Tobacco Use  . Smoking status: Unknown If Ever Smoked  . Smokeless tobacco: Never Used  Substance Use Topics  . Alcohol use: Never  . Drug use: Never    Level 5 caveat: Unable to obtain review of Systems due to dementia     ____________________________________________   PHYSICAL  EXAM:  VITAL SIGNS: ED Triage Vitals  Enc Vitals Group     BP 03/09/20 1733 (!) 148/80     Pulse Rate 03/09/20 1733 81     Resp 03/09/20 1733 20     Temp 03/09/20 1733 99 F (37.2 C)     Temp Source 03/09/20 1733 Oral     SpO2 03/09/20 1733 100 %     Weight 03/09/20 1735 69.7 kg (153 lb 9.6 oz)     Height 03/09/20 1735 1.892 m (6' 2.5")     Head Circumference --      Peak Flow --      Pain Score --      Pain Loc --      Pain Edu? --      Excl. in GC? --     Constitutional: Alert, no acute distress Eyes: Conjunctivae are normal.  Head: Atraumatic. Nose: No epistaxis or swelling Mouth/Throat: Mucous membranes are moist.   Neck:  Painless ROM, no vertebral tenderness palpation Cardiovascular: Normal rate, regular rhythm.   Good peripheral circulation. Respiratory: Normal respiratory effort.  No retractions.  Gastrointestinal: Soft and nontender. No distention.  No CVA tenderness.  Musculoskeletal: No pain with axial load on both hips, no chest wall tenderness palpation.  Full range of motion of all extremities. Neurologic:  Normal speech and language. No gross focal neurologic deficits are appreciated.  Skin:  Skin is warm, dry and intact. Psychiatric: Mood and affect are normal. Speech and behavior are normal.  ____________________________________________   LABS (all labs ordered are listed, but only abnormal results are displayed)  Labs Reviewed  LACTIC ACID, PLASMA - Abnormal; Notable for the following components:      Result Value   Lactic Acid, Venous 3.0 (*)    All other components within normal limits  COMPREHENSIVE METABOLIC PANEL - Abnormal; Notable for the following components:   Glucose, Bld 127 (*)    Calcium 8.6 (*)    Total Protein 6.0 (*)    All other components within normal limits  CBC WITH DIFFERENTIAL/PLATELET - Abnormal; Notable for the following components:   RBC 3.46 (*)    Hemoglobin 11.5 (*)    HCT 35.6 (*)    MCV 102.9 (*)    Lymphs Abs  0.4 (*)    All other components within normal limits  URINALYSIS, ROUTINE W REFLEX MICROSCOPIC - Abnormal; Notable for the following components:   Color, Urine YELLOW (*)    APPearance CLEAR (*)    All other components within normal limits  RESPIRATORY PANEL BY RT PCR (FLU A&B, COVID)  CULTURE, BLOOD (ROUTINE X 2)  CULTURE, BLOOD (ROUTINE X 2)  URINE CULTURE  APTT  PROTIME-INR  PROCALCITONIN  LACTIC ACID, PLASMA   ____________________________________________  EKG  ED ECG REPORT I, Jene Every, the attending physician, personally viewed and interpreted this ECG.  Date: 03/09/2020  Rhythm: Atrial fibrillation QRS  Axis: normal Intervals: Abnormal ST/T Wave abnormalities: normal Narrative Interpretation: no evidence of acute ischemia  ____________________________________________  RADIOLOGY  Chest x-ray unremarkable viewed by me ____________________________________________   PROCEDURES  Procedure(s) performed: No  Procedures   Critical Care performed: No ____________________________________________   INITIAL IMPRESSION / ASSESSMENT AND PLAN / ED COURSE  Pertinent labs & imaging results that were available during my care of the patient were reviewed by me and considered in my medical decision making (see chart for details).  Patient reportedly with elevated temperature by EMS however afebrile here and relatively well-appearing.  Given the reports of fever and altered mental status will call a code sepsis.   ----------------------------------------- 6:44 PM on 03/09/2020 -----------------------------------------  Lab work is quite reassuring, white blood cell count is normal.  Patient remains afebrile, chest x-ray is normal, pending urinalysis.  At this point doubt sepsis given the above but pending lactic acid.  ----------------------------------------- 6:49 PM on 03/09/2020 -----------------------------------------  Notified of lactic acid of 3, still  no clear source of sepsis will enter note for urine as needed.  Will cover with broad-spectrum antibiotics given reports of fever  ----------------------------------------- 8:50 PM on 03/09/2020 -----------------------------------------  Urinalysis is overall reassuring, coronavirus panel is negative.  Will admit to the hospitalist service for further evaluation, blood cultures have been sent    ____________________________________________   FINAL CLINICAL IMPRESSION(S) / ED DIAGNOSES  Final diagnoses:  Sepsis, due to unspecified organism, unspecified whether acute organ dysfunction present [facility])        Note:  This document was prepared using Dragon voice recognition software and may include unintentional dictation errors.   Lavonia Drafts, MD 03/09/20 2051

## 2020-03-09 NOTE — ED Triage Notes (Addendum)
Pt here via [facility] from [facility] d/t an unwitnessed fall today. Unknown LOC/ if pt hit his head. Pt denies any pain at this time. Pt does have small skin tear/ abrasion on his right hand.   Staff at facility report that pt is "more altered than normal", pt is oriented to self and place only at this time. Per pt's wife is has dementia at baseline.   EMS VS temp 102.7 temporal. Cbg 147.

## 2020-03-10 ENCOUNTER — Inpatient Hospital Stay: Payer: Medicare Other

## 2020-03-10 ENCOUNTER — Inpatient Hospital Stay
Admit: 2020-03-10 | Discharge: 2020-03-10 | Disposition: A | Discharge status: Home / Self Care | Payer: Medicare Other | Attending: Family Medicine | Admitting: Family Medicine

## 2020-03-10 ENCOUNTER — Encounter: Payer: Self-pay | Admitting: Family Medicine

## 2020-03-10 DIAGNOSIS — F0391 Unspecified dementia with behavioral disturbance: Secondary | ICD-10-CM

## 2020-03-10 DIAGNOSIS — R296 Repeated falls: Secondary | ICD-10-CM

## 2020-03-10 DIAGNOSIS — M542 Cervicalgia: Secondary | ICD-10-CM

## 2020-03-10 DIAGNOSIS — E872 Acidosis: Secondary | ICD-10-CM

## 2020-03-10 DIAGNOSIS — E876 Hypokalemia: Secondary | ICD-10-CM

## 2020-03-10 LAB — CBC
HCT: 27.8 % — ABNORMAL LOW (ref 39.0–52.0)
Hemoglobin: 9 g/dL — ABNORMAL LOW (ref 13.0–17.0)
MCH: 33 pg (ref 26.0–34.0)
MCHC: 32.4 g/dL (ref 30.0–36.0)
MCV: 101.8 fL — ABNORMAL HIGH (ref 80.0–100.0)
Platelets: 168 10*3/uL (ref 150–400)
RBC: 2.73 MIL/uL — ABNORMAL LOW (ref 4.22–5.81)
RDW: 13.7 % (ref 11.5–15.5)
WBC: 4.6 10*3/uL (ref 4.0–10.5)
nRBC: 0 % (ref 0.0–0.2)

## 2020-03-10 LAB — URINE CULTURE: Culture: NO GROWTH

## 2020-03-10 LAB — BASIC METABOLIC PANEL
Anion gap: 5 (ref 5–15)
BUN: 13 mg/dL (ref 8–23)
CO2: 25 mmol/L (ref 22–32)
Calcium: 7.6 mg/dL — ABNORMAL LOW (ref 8.9–10.3)
Chloride: 109 mmol/L (ref 98–111)
Creatinine, Ser: 0.58 mg/dL — ABNORMAL LOW (ref 0.61–1.24)
GFR calc Af Amer: >60 mL/min (ref >60)
GFR calc non Af Amer: >60 mL/min (ref >60)
Glucose, Bld: 104 mg/dL — ABNORMAL HIGH (ref 70–99)
Potassium: 2.9 mmol/L — ABNORMAL LOW (ref 3.5–5.1)
Sodium: 139 mmol/L (ref 135–145)

## 2020-03-10 LAB — ECHOCARDIOGRAM COMPLETE
Height: 74.5 in
Weight: 2457.6 oz

## 2020-03-10 LAB — PROTIME-INR
INR: 1.2 (ref 0.8–1.2)
Prothrombin Time: 14.8 seconds (ref 11.4–15.2)

## 2020-03-10 LAB — MRSA PCR SCREENING: MRSA by PCR: NEGATIVE

## 2020-03-10 LAB — PROCALCITONIN: Procalcitonin: <0.1 ng/mL

## 2020-03-10 LAB — CORTISOL-AM, BLOOD: Cortisol - AM: 19.3 ug/dL (ref 6.7–22.6)

## 2020-03-10 MED ORDER — POTASSIUM CHLORIDE IN NACL 20-0.9 MEQ/L-% IV SOLN
INTRAVENOUS | Status: DC
  Filled 2020-03-10 (×4): qty 1000

## 2020-03-10 MED ORDER — SODIUM CHLORIDE 0.9 % IV SOLN
2.0000 g | Freq: Three times a day (TID) | INTRAVENOUS | Status: DC
  Administered 2020-03-10 – 2020-03-12 (×6): 2 g via INTRAVENOUS
  Filled 2020-03-10 (×9): qty 2

## 2020-03-10 MED ORDER — POTASSIUM CHLORIDE CRYS ER 20 MEQ PO TBCR
40.0000 meq | EXTENDED_RELEASE_TABLET | Freq: Three times a day (TID) | ORAL | Status: DC
  Filled 2020-03-10: qty 2

## 2020-03-10 MED ORDER — HALOPERIDOL LACTATE 5 MG/ML IJ SOLN
2.0000 mg | Freq: Once | INTRAMUSCULAR | Status: AC
  Administered 2020-03-10: 21:00:00 2 mg via INTRAVENOUS
  Filled 2020-03-10: qty 1

## 2020-03-10 MED ORDER — IOHEXOL 300 MG/ML  SOLN
75.0000 mL | Freq: Once | INTRAMUSCULAR | Status: AC | PRN
  Administered 2020-03-10: 14:00:00 75 mL via INTRAVENOUS

## 2020-03-10 MED ORDER — POTASSIUM CHLORIDE 20 MEQ PO PACK
40.0000 meq | PACK | Freq: Three times a day (TID) | ORAL | Status: AC
  Administered 2020-03-10 (×2): 40 meq via ORAL
  Filled 2020-03-10 (×3): qty 2

## 2020-03-10 NOTE — Progress Notes (Signed)
Pharmacy Antibiotic Note  Glenn Barker is a 86 y.o. male admitted on 03/09/2020 with sepsis.  Pharmacy has been consulted for Vancomycin, Cefepime  dosing.  Plan: Cefepime 2 g IV X 1 given in ED on 3/28 @ 1900.  Cefepime 2 g IV Q12H ordered to start on 3/29 @ 0700. Improved renal function with CrCl now >60 ml/min. Will adjust dose to cefepime 2g IV q8h.    Vancomycin 1 g IV X 1 given in ED on 3/28 @ 1900.  Additional Vanc 500 mg IV X 1 given 3/28 @ 2314 to make total loading dose of 1500 mg.  Will continue Vancomycin 1500 mg IV Q24H ordered to start on 3/29 @ 1900.   Goal AUC 400-550. Expected AUC: 493 SCr used: 0.8 Expected Cmin: 10 mcg/mL  Will order SCr for AM to continue to monitor renal function.    Height: 6' 2.5" (189.2 cm) Weight: 153 lb 9.6 oz (69.7 kg) IBW/kg (Calculated) : 83.35  Temp (24hrs), Avg:99.4 F (37.4 C), Min:99 F (37.2 C), Max:99.7 F (37.6 C)  Recent Labs  Lab 03/09/20 1748 03/09/20 2129 03/10/20 0613  WBC 7.3  --  4.6  CREATININE 0.96  --  0.58*  LATICACIDVEN 3.0* 1.4  --     Estimated Creatinine Clearance: 67.8 mL/min (A) (by C-G formula based on SCr of 0.58 mg/dL (L)).    Allergies  Allergen Reactions  . Gabapentin   . Pregabalin     Antimicrobials this admission: Cefepime 3/28 >> Vanc 3/28 >> Flagyl 3/28 >>  Dose adjustments this admission:   Microbiology results:  BCx: NGTD x2  UCx:  Collected    Thank you for allowing pharmacy to be a part of this patient's care.  Marty Heck 03/10/2020 9:08 AM

## 2020-03-10 NOTE — ED Notes (Signed)
Pt transferred to hospital bed for comfort.

## 2020-03-10 NOTE — ED Notes (Signed)
Pt being transported to rm 116 at this time via hospital bed.

## 2020-03-10 NOTE — ED Notes (Signed)
Pt cleaned of stool and urine. Has taken external catheter off.  New sheets and gown placed.  Pt placed on monitor. Fall alarm turned on. Bed placed in low position.

## 2020-03-10 NOTE — Progress Notes (Signed)
Patient ID: Glenn Barker, male   DOB: March 06, 1933, 86 y.o.   MRN: [id number] [facility] PROGRESS NOTE  Glenn Barker QDI:[id number] DOB: Feb 03, 1933 DOA: 03/09/2020 PCP: [facility]  HPI/Subjective: Patient not the best historian.  In speaking with the patient's wife he has dementia and is in the memory care unit.  He had an unwitnessed fall and was found to have a temperature of 102.  He was sent in for further evaluation.  In the ER he had a CT scan of the head which was negative.  To me the patient does complain of some neck pain and some cough.  The patient fell asleep on me 3 times while I was talking with him.  The patient also complains of feeling tired.  Objective: Vitals:   03/10/20 1003 03/10/20 1039  BP: (!) 149/77 (!) 158/72  Pulse: 61 76  Resp: 16 17  Temp: 98.4 F (36.9 C) 98.2 F (36.8 C)  SpO2: 100% 100%    Intake/Output Summary (Last 24 hours) at 03/10/2020 1153 Last data filed at 03/10/2020 4076 Gross per 24 hour  Intake 700 ml  Output 725 ml  Net -25 ml   Filed Weights   03/09/20 1735  Weight: 69.7 kg    ROS: Review of Systems  Unable to perform ROS: Acuity of condition  Respiratory: Positive for cough. Negative for shortness of breath.   Cardiovascular: Negative for chest pain.  Gastrointestinal: Negative for abdominal pain.  Musculoskeletal: Positive for neck pain.   Exam: Physical Exam  Constitutional: He appears lethargic.  HENT:  Nose: No mucosal edema.  Mouth/Throat: No oropharyngeal exudate.  Eyes: Conjunctivae and lids are normal.  Neck: Carotid bruit is not present.  Patient complains of pain when I palpate on the back of his neck but difficult to localize.  Cardiovascular: S1 normal and S2 normal.  Murmur heard.  Systolic murmur is present with a grade of 2/6. Pulses:      Dorsalis pedis pulses are 1+ on the right side and 1+ on the left side.  Respiratory: He has no decreased breath sounds. He has no wheezes. He has no  rhonchi. He has no rales.  GI: Soft. Bowel sounds are normal. There is no abdominal tenderness.  Musculoskeletal:     Right ankle: No swelling.     Left ankle: No swelling.  Lymphadenopathy:    He has no cervical adenopathy.  Neurological: He appears lethargic.  Moves his arms on his own  Skin: Skin is warm. No rash noted. Nails show no clubbing.  Psychiatric:  Lethargic but does answer few questions.      Data Reviewed: Basic Metabolic Panel: Recent Labs  Lab 03/09/20 1748 03/10/20 0613  NA 139 139  K 4.1 2.9*  CL 105 109  CO2 26 25  GLUCOSE 127* 104*  BUN 22 13  CREATININE 0.96 0.58*  CALCIUM 8.6* 7.6*   Liver Function Tests: Recent Labs  Lab 03/09/20 1748  AST 20  ALT 12  ALKPHOS 52  BILITOT 0.8  PROT 6.0*  ALBUMIN 3.5   CBC: Recent Labs  Lab 03/09/20 1748 03/10/20 0613  WBC 7.3 4.6  NEUTROABS 6.6  --   HGB 11.5* 9.0*  HCT 35.6* 27.8*  MCV 102.9* 101.8*  PLT 225 168     Recent Results (from the past 240 hour(s))  Blood Culture (routine x 2)     Status: None (Preliminary result)   Collection Time: 03/09/20  5:48 PM   Specimen: BLOOD  Result  Value Ref Range Status   Specimen Description BLOOD LEFT ANTECUBITAL  Final   Special Requests   Final    BOTTLES DRAWN AEROBIC AND ANAEROBIC Blood Culture adequate volume   Culture   Final    NO GROWTH < 24 HOURS Performed at [facility], [address] [place], [place] [address]    Report Status PENDING  Incomplete  Blood Culture (routine x 2)     Status: None (Preliminary result)   Collection Time: 03/09/20  5:48 PM   Specimen: BLOOD  Result Value Ref Range Status   Specimen Description BLOOD BLOOD LEFT HAND  Final   Special Requests   Final    BOTTLES DRAWN AEROBIC AND ANAEROBIC Blood Culture results may not be optimal due to an inadequate volume of blood received in culture bottles   Culture   Final    NO GROWTH < 24 HOURS Performed at [facility], [address],  [place], [place] [address]    Report Status PENDING  Incomplete  Respiratory Panel by RT PCR (Flu A&B, Covid) - Nasopharyngeal Swab     Status: None   Collection Time: 03/09/20  6:31 PM   Specimen: Nasopharyngeal Swab  Result Value Ref Range Status   SARS Coronavirus 2 by RT PCR NEGATIVE NEGATIVE Final    Comment: (NOTE) SARS-CoV-2 target nucleic acids are NOT DETECTED. The SARS-CoV-2 RNA is generally detectable in upper respiratoy specimens during the acute phase of infection. The lowest concentration of SARS-CoV-2 viral copies this assay can detect is 131 copies/mL. A negative result does not preclude SARS-Cov-2 infection and should not be used as the sole basis for treatment or other patient management decisions. A negative result may occur with  improper specimen collection/handling, submission of specimen other than nasopharyngeal swab, presence of viral mutation(s) within the areas targeted by this assay, and inadequate number of viral copies (<131 copies/mL). A negative result must be combined with clinical observations, patient history, and epidemiological information. The expected result is Negative. Fact Sheet for Patients:  PinkCheek.be Fact Sheet for Healthcare Providers:  GravelBags.it This test is not yet ap proved or cleared by the [place] FDA and  has been authorized for detection and/or diagnosis of SARS-CoV-2 by FDA under an Emergency Use Authorization (EUA). This EUA will remain  in effect (meaning this test can be used) for the duration of the COVID-19 declaration under Section 564(b)(1) of the Act, 21 U.S.C. section 360bbb-3(b)(1), unless the authorization is terminated or revoked sooner.    Influenza A by PCR NEGATIVE NEGATIVE Final   Influenza B by PCR NEGATIVE NEGATIVE Final    Comment: (NOTE) The Xpert Xpress SARS-CoV-2/FLU/RSV assay is intended as an aid in  the diagnosis of influenza from  Nasopharyngeal swab specimens and  should not be used as a sole basis for treatment. Nasal washings and  aspirates are unacceptable for Xpert Xpress SARS-CoV-2/FLU/RSV  testing. Fact Sheet for Patients: PinkCheek.be Fact Sheet for Healthcare Providers: GravelBags.it This test is not yet approved or cleared by the [place] FDA and  has been authorized for detection and/or diagnosis of SARS-CoV-2 by  FDA under an Emergency Use Authorization (EUA). This EUA will remain  in effect (meaning this test can be used) for the duration of the  Covid-19 declaration under Section 564(b)(1) of the Act, 21  U.S.C. section 360bbb-3(b)(1), unless the authorization is  terminated or revoked. Performed at [facility], [address] [place], [place] [address]  Studies: CT Head Wo Contrast  Result Date: 03/09/2020 CLINICAL DATA:  Unwitnessed fall, head injury EXAM: CT HEAD WITHOUT CONTRAST TECHNIQUE: Contiguous axial images were obtained from the base of the skull through the vertex without intravenous contrast. COMPARISON:  01/07/2020 FINDINGS: Brain: No evidence of acute infarction, hemorrhage, hydrocephalus, extra-axial collection or mass lesion/mass effect. Global cortical and central atrophy. Subcortical white matter and periventricular small vessel ischemic changes. Vascular: Intracranial atherosclerosis. Skull: Normal. Negative for fracture or focal lesion. Sinuses/Orbits: The visualized paranasal sinuses are essentially clear. The mastoid air cells are unopacified. Other: None. IMPRESSION: No evidence of acute intracranial abnormality. Atrophy with small vessel ischemic changes. Electronically Signed   By: Charline Bills M.D.   On: 03/09/2020 20:13   DG Chest Port 1 View  Result Date: 03/09/2020 CLINICAL DATA:  Dyspnea, fall EXAM: PORTABLE CHEST 1 VIEW COMPARISON:  01/07/2020 chest radiograph. FINDINGS: Stable  cardiomediastinal silhouette with normal heart size. No pneumothorax. No pleural effusion. Minimal bibasilar scarring versus atelectasis. No pulmonary edema. No acute consolidative airspace disease. No displaced fractures. Surgical clips overlie the lower cervical spine, unchanged. IMPRESSION: Minimal bibasilar scarring versus atelectasis. Electronically Signed   By: Delbert Phenix M.D.   On: 03/09/2020 18:18    Scheduled Meds: . amLODipine  10 mg Oral Daily  . aspirin EC  81 mg Oral QPM  . atorvastatin  40 mg Oral q1800  . cholecalciferol  2,000 Units Oral Daily  . clopidogrel  75 mg Oral Daily  . enoxaparin (LOVENOX) injection  40 mg Subcutaneous Q24H  . lisinopril  20 mg Oral Daily  . metoprolol succinate  50 mg Oral QPM  . potassium chloride  40 mEq Oral TID  . risperiDONE  0.5 mg Oral TID  . traZODone  75 mg Oral QHS  . Triple Antibiotic  1 application Topical Daily  . venlafaxine  75 mg Oral BID WC  . vitamin B-12  1,000 mcg Oral Daily   Continuous Infusions: . 0.9 % NaCl with KCl 20 mEq / L 50 mL/hr at 03/10/20 0827  . ceFEPime (MAXIPIME) IV    . metronidazole Stopped (03/10/20 0552)  . vancomycin      Assessment/Plan:  1. Suspected sepsis but unclear cause.  Fever of 102 by EMS.  Patient on aggressive antibiotics with cefepime, vancomycin and Flagyl.  The patient complains of a cough but chest x-ray read is negative.  I will repeat a chest x-ray tomorrow after IV fluid hydration today.  Patient does complain of some neck pain so get a CT scan of the cervical spine to rule out infection.  Of note procalcitonin x2 negative.  2. Unwitnessed fall.  CT scan of the cervical spine to rule out trauma. 3. Hypokalemia replace potassium orally and in IV fluids. 4. Lactic acidosis improved with fluid 5. Dementia with behavioral disturbance continue risperidone and trazodone 6. We will get swallow evaluation 7. Essential hypertension on lisinopril and Norvasc 8. Hyperlipidemia unspecified  on atorvastatin 9. History of CAD on aspirin, Plavix, metoprolol and atorvastatin  Code Status:     Code Status Orders  (From admission, onward)         Start     Ordered   03/09/20 2059  Do not attempt resuscitation (DNR)  Continuous    Question Answer Comment  In the event of cardiac or respiratory ARREST Do not call a "code blue"   In the event of cardiac or respiratory ARREST Do not perform Intubation, CPR, defibrillation or ACLS   In  the event of cardiac or respiratory ARREST Use medication by any route, position, wound care, and other measures to relive pain and suffering. May use oxygen, suction and manual treatment of airway obstruction as needed for comfort.   Comments nurse may pronounce      03/09/20 2107        Code Status History    Date Active Date Inactive Code Status Order ID Comments User Context   01/08/2020 1145 01/11/2020 1650 DNR [id number]  Alford Highland, MD Inpatient   01/07/2020 1649 01/08/2020 1145 Full Code [id number]  Alford Highland, MD ED   Advance Care Planning Activity    Advance Directive Documentation     Most Recent Value  Type of Advance Directive  Out of facility DNR (pink MOST or yellow form)  Pre-existing out of facility DNR order (yellow form or pink MOST form)  --  "MOST" Form in Place?  --     Family Communication: Spoke with the patient's wife on the phone Disposition Plan: We will have to figure out a source of sepsis.  CT scan of cervical spine ordered for today and chest x-ray for tomorrow.  Temperature curve will have to be normal.  We will also have to get physical therapy to see if he is stable to go back to his memory care unit.  May need a few days of IV antibiotics.  Antibiotics:  Vancomycin  Cefepime  Flagyl  Time spent: 28 minutes  [employer]

## 2020-03-10 NOTE — Progress Notes (Signed)
*  PRELIMINARY RESULTS* Echocardiogram 2D Echocardiogram has been performed.  Glenn Barker Dal Blew 03/10/2020, 9:06 AM

## 2020-03-10 NOTE — ED Notes (Signed)
Pt trying to get out of bed. This tech and Amy B., RN at bedside. Pt incontinent of stool. Pt cleaned and changed. Clean linen placed on bed. Two warm blankets placed on pt. Call light within reach. Pt made aware to use call light for any further needs.

## 2020-03-10 NOTE — ED Notes (Signed)
Pt given breakfast tray. Tray set up for pt. Pt is able to eat independently. Pt instructed to use call light for any needs. Pt has no further needs at this time.

## 2020-03-10 NOTE — ED Notes (Signed)
NT in room to give pt meal tray and help set up and eat.

## 2020-03-10 NOTE — Progress Notes (Signed)
Pt highly confused. Difficult to redirect. Pt setting off bed alarm multiple times within a ten minute time span. Has removed both points of IV access. Mitts applied. Low bed ordered. None are currently available in storage. NP made aware.

## 2020-03-10 NOTE — ED Notes (Signed)
Yellow socks placed on pt at this time.

## 2020-03-10 NOTE — ED Notes (Signed)
Pt changed and clean chux pads placed beneath patient.

## 2020-03-11 ENCOUNTER — Inpatient Hospital Stay: Payer: Medicare Other

## 2020-03-11 DIAGNOSIS — R410 Disorientation, unspecified: Secondary | ICD-10-CM

## 2020-03-11 LAB — BASIC METABOLIC PANEL
Anion gap: 9 (ref 5–15)
BUN: 10 mg/dL (ref 8–23)
CO2: 27 mmol/L (ref 22–32)
Calcium: 8.9 mg/dL (ref 8.9–10.3)
Chloride: 101 mmol/L (ref 98–111)
Creatinine, Ser: 0.58 mg/dL — ABNORMAL LOW (ref 0.61–1.24)
GFR calc Af Amer: >60 mL/min (ref >60)
GFR calc non Af Amer: >60 mL/min (ref >60)
Glucose, Bld: 109 mg/dL — ABNORMAL HIGH (ref 70–99)
Potassium: 3.2 mmol/L — ABNORMAL LOW (ref 3.5–5.1)
Sodium: 137 mmol/L (ref 135–145)

## 2020-03-11 LAB — HEMOGLOBIN: Hemoglobin: 11.3 g/dL — ABNORMAL LOW (ref 13.0–17.0)

## 2020-03-11 LAB — MAGNESIUM: Magnesium: 1.9 mg/dL (ref 1.7–2.4)

## 2020-03-11 MED ORDER — HALOPERIDOL LACTATE 5 MG/ML IJ SOLN: 2.0000 mg | Freq: Four times a day (QID) | INTRAMUSCULAR | Status: DC | PRN

## 2020-03-11 MED ORDER — POTASSIUM CHLORIDE 20 MEQ PO PACK
20.0000 meq | PACK | Freq: Two times a day (BID) | ORAL | Status: DC
  Administered 2020-03-11 (×2): 20 meq via ORAL
  Filled 2020-03-11 (×2): qty 1

## 2020-03-11 MED ORDER — QUETIAPINE FUMARATE 25 MG PO TABS
25.0000 mg | ORAL_TABLET | Freq: Every day | ORAL | Status: DC
  Administered 2020-03-11: 25 mg via ORAL
  Filled 2020-03-11: qty 1
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
## 2020-03-11 NOTE — Progress Notes (Signed)
PT Cancellation Note  Patient Details Name: Glenn Barker MRN: [id number] DOB: 31-Jul-1933   Cancelled Treatment:    Reason Eval/Treat Not Completed: Other (comment). Evaluation attempted however pt sleeping upon arrival. Sitter in room reports he didn't sleep last night and has been asleep for a few hours. Attempted to awaken pt with verbal/tactile cues. Pt opens eyes briefly, however unable to maintain alertness to perform exertional mobility at this time. Per sitter, pt has been ambulatory (when awake) and able to walk to bathroom in room. Will re-attempt next date pending lethargy.   Quintella Mura 03/11/2020, 1:59 PM Elizabeth Palau, PT, DPT [phone]

## 2020-03-11 NOTE — Evaluation (Signed)
Clinical/Bedside Swallow Evaluation Patient Details  Name: Glenn Barker MRN: [id number] Date of Birth: [birth year]  Today's Date: 03/11/2020 Time: SLP Start Time (ACUTE ONLY): 1020 SLP Stop Time (ACUTE ONLY): 1110 SLP Time Calculation (min) (ACUTE ONLY): 50 min  Past Medical History:  Past Medical History:  Diagnosis Date  . Coronary artery disease   . Dementia (HCC)   . Hypertension    Past Surgical History:  Past Surgical History:  Procedure Laterality Date  . NO PAST SURGERIES     HPI:  Pt is a 86 y.o. male w/ PMH of CAD, Dementia, HTN, cellulitis, who presents after a unwitnessed fall reportedly from facility.  The patient is a history of dementia is unable to provide any history.  Facility reports the patient has seemed more altered today than typical.  CXR: "Mild left base atelectasis. Lungs elsewhere clear".   Assessment / Plan / Recommendation Clinical Impression  Pt appears to present w/ adequate oropharyngeal phase swallowing w/ no overt clinical s/s of aspiration or neuromuscular deficits noted during po trials given. Pt does have a Baseline of Dementia w/ significant confusion and distraction at this time requiring a Sitter in the room for safety. Pt is at reduced risk for aspiration when following general aspiration precautions and monitored during the meals for follow through and attention to tasks. Pt needed assistance for upright positioning in bed. Pt was verbal and answered basic questions though accuracy inconsistent; did not followed general instrtuctions given cues though was able to feed self w/ visual cues and presentation support -- seemed automatic tasks were ok. Pt consumed ~8 ozs of thin liquids VIA CUP and puree/soft solids w/ No overt clinical s/s of aspiration noted; no wet vocal quality or decline in respiratory status during/post trials. Oral phase c/b grossly adequate bolus management and mastication of softened solids -- min extra time given d/t distraction  intermittently. Oral clearing of all consistencies noted. Pt encouraged to alternate solids w/ moist purees, liquids. OM exam grossly WFL. No unilateral weakness noted. Pt fed self w/ support. Recommend a more mech soft diet consistency for ease of oral intake d/t Cognitive decline, thin liquids VIA CUP as pt seems to prefer. Recommend general aspiration precautions, and full support in positioning upright for all oral intake. Recommend Pills Crushed in Puree for swallowing -- pt chewed the Pills whole w/ NSG per report. Monitoring at meals for any needs d/t Cognitive decline. ST services can be available for further education if needed while admitted. Recommend Dietician f/u for support.  SLP Visit Diagnosis: Dysphagia, unspecified (R13.10)    Aspiration Risk  (reduced risk following general precautions)    Diet Recommendation  Mech Soft diet w/ gravies to moisten foods; Thin liquids. General aspiration precautions. Tray setup and support and monitoring at meals d/t Cognitive decline  Medication Administration: Crushed with puree(for ease of swallowing d/t chewing of them)    Other  Recommendations Recommended Consults: (Dietician f/u for support) Oral Care Recommendations: Oral care BID;Oral care before and after PO;Staff/trained caregiver to provide oral care Other Recommendations: (n/a)   Follow up Recommendations None      Frequency and Duration (n/a)  (n/a)       Prognosis Prognosis for Safe Diet Advancement: Fair(-Good) Barriers to Reach Goals: Cognitive deficits;Time post onset;Severity of deficits;Behavior      Swallow Study   General Date of Onset: 03/09/20 HPI: Pt is a 86 y.o. male w/ PMH of CAD, Dementia, HTN, cellulitis, who presents after a unwitnessed fall reportedly from  facility.  The patient is a history of dementia is unable to provide any history.  Facility reports the patient has seemed more altered today than typical.  CXR: "Mild left base atelectasis. Lungs  elsewhere clear". Type of Study: Bedside Swallow Evaluation Previous Swallow Assessment: none Diet Prior to this Study: Dysphagia 3 (soft);Nectar-thick liquids(at admit) Temperature Spikes Noted: No(wbc 4.6) Respiratory Status: Room air History of Recent Intubation: No Behavior/Cognition: Alert;Cooperative;Pleasant mood;Confused;Distractible;Requires cueing;Doesn't follow directions Oral Cavity Assessment: Within Functional Limits Oral Care Completed by SLP: Recent completion by staff Oral Cavity - Dentition: Adequate natural dentition Vision: Functional for self-feeding Self-Feeding Abilities: Able to feed self;Needs assist;Needs set up;Total assist(Cognitive decline) Patient Positioning: Upright in bed(needed positioning w/ head forward) Baseline Vocal Quality: Normal Volitional Cough: Cognitively unable to elicit Volitional Swallow: Unable to elicit    Oral/Motor/Sensory Function Overall Oral Motor/Sensory Function: Within functional limits   Ice Chips Ice chips: Within functional limits Presentation: Spoon(fed; 3 trials)   Thin Liquid Thin Liquid: Within functional limits Presentation: Cup;Self Fed(~8 ozs total)    Nectar Thick Nectar Thick Liquid: Not tested   Honey Thick Honey Thick Liquid: Not tested   Puree Puree: Within functional limits Presentation: Self Fed;Spoon(8 trials)   Solid     Solid: Within functional limits Presentation: Spoon;Self Fed(6 trials)       Orinda Kenner, MS, CCC-SLP [facility] 03/11/2020,12:00 PM

## 2020-03-12 DIAGNOSIS — G9340 Encephalopathy, unspecified: Secondary | ICD-10-CM

## 2020-03-12 DIAGNOSIS — R6520 Severe sepsis without septic shock: Secondary | ICD-10-CM

## 2020-03-12 LAB — CBC
HCT: 34.6 % — ABNORMAL LOW (ref 39.0–52.0)
Hemoglobin: 11.4 g/dL — ABNORMAL LOW (ref 13.0–17.0)
MCH: 32.9 pg (ref 26.0–34.0)
MCHC: 32.9 g/dL (ref 30.0–36.0)
MCV: 100 fL (ref 80.0–100.0)
Platelets: 222 10*3/uL (ref 150–400)
RBC: 3.46 MIL/uL — ABNORMAL LOW (ref 4.22–5.81)
RDW: 13.3 % (ref 11.5–15.5)
WBC: 5.9 10*3/uL (ref 4.0–10.5)
nRBC: 0 % (ref 0.0–0.2)

## 2020-03-12 LAB — BASIC METABOLIC PANEL
Anion gap: 8 (ref 5–15)
BUN: 12 mg/dL (ref 8–23)
CO2: 28 mmol/L (ref 22–32)
Calcium: 8.6 mg/dL — ABNORMAL LOW (ref 8.9–10.3)
Chloride: 106 mmol/L (ref 98–111)
Creatinine, Ser: 0.83 mg/dL (ref 0.61–1.24)
GFR calc Af Amer: >60 mL/min (ref >60)
GFR calc non Af Amer: >60 mL/min (ref >60)
Glucose, Bld: 92 mg/dL (ref 70–99)
Potassium: 3.1 mmol/L — ABNORMAL LOW (ref 3.5–5.1)
Sodium: 142 mmol/L (ref 135–145)

## 2020-03-12 LAB — PROCALCITONIN: Procalcitonin: <0.1 ng/mL

## 2020-03-12 MED ORDER — QUETIAPINE FUMARATE 25 MG PO TABS: 25.0000 mg | ORAL_TABLET | Freq: Every day | ORAL | 0 refills | Status: AC

## 2020-03-12 MED ORDER — POTASSIUM CHLORIDE CRYS ER 20 MEQ PO TBCR
40.0000 meq | EXTENDED_RELEASE_TABLET | Freq: Once | ORAL | Status: AC
  Administered 2020-03-12: 10:00:00 40 meq via ORAL
  Filled 2020-03-12: qty 2

## 2020-03-12 NOTE — TOC Transition Note (Signed)
Transition of Care [facility]) - CM/SW Discharge Note   Patient Details  Name: Glenn Barker MRN: [id number] Date of Birth: November 07, 1933  Transition of Care [facility] CM/SW Contact:  Lucy Chris, LCSW Phone Number: 03/12/2020, 11:56 AM   Clinical Narrative: Pt a resident of [facility] care unit. Spoke with wife-Edith via telephone to inform return to memory care later today. Also spoke with Sam_RN at [facility] to inform medically stable to return to facility. Will fax information and set up EMS when ready. Will place packet in chart. Wife feels will not visit today due to will be too confusing for him so does not plan to come in today. Await MD to complete paperwork for him to return        Barriers to Discharge: Barriers Resolved   Patient Goals and CMS Choice        Discharge Placement                       Discharge Plan and Services In-house Referral: Clinical Social Work                                   Social Determinants of Health (SDOH) Interventions     Readmission Risk Interventions No flowsheet data found.

## 2020-03-12 NOTE — Discharge Summary (Signed)
Glenn Barker WUJ:[id number] DOB: [birth year] DOA: 03/09/2020  PCP: [facility]  Admit date: 03/09/2020 Discharge date: 03/12/2020  [name] Dis[name]  Recommendations for Outpatient Follow-up:  1. Follow up with PCP in 1 week 2. Please obtain BMP/CBC in one week 3. Please follow up on the following pending results:blood culture and urine culture     Discharge Condition:Stable CODE STATUS:DNR Diet recommendation:Dysphagia 3.   Brief/Interim Summary: Glenn Barker  is a 86 y.o. male with a known history of hypertension, dementia and coronary artery disease, who presented to the emergency room with acute onset of altered mental status which has been worsening at [facility] assisted living facility in the memory care unit with unwitnessed fall today with subsequent right hand contusion and questionable head injury.  The patient admitted to headache before his fall and dizziness earlier.  He denied any nausea or vomiting or abdominal pain.  No reported chest pain or dyspnea or cough or wheezing. He does have bowel and bladder incontinence per his wife.  History is limited by the patient's dementia.  EMS reported temperature of 102.7.  He was not given Tylenol per his wife.  His blood glucose was 147. He was started on IV antibiotics added on cefepime however urinalysis was negative, urine culture and blood cultures so far has been negative.  He has been afebrile.  He did have acute delirium and he was not sleeping at night.  He was started on Seroquel and tolerated well.  Repeat chest x-ray has been negative.  There was a question whether dehydration could have played a role with the fever.  He did have hypokalemia with replacement.  He had an unwitnessed fall and neck pain CT scan of the cervical spine was negative for infection or fracture.  He is stable and at baseline to be returned back to memory center.  Discharge  Diagnoses:  Active Problems:   Sepsis (HCC)   Unwitnessed fall   Neck pain   Hypokalemia   Acute delirium    Discharge Instructions  Discharge Instructions    Call MD for:  temperature >100.4   Complete by: As directed    Diet - low sodium heart healthy   Complete by: As directed    Increase activity slowly   Complete by: As directed      Allergies as of 03/12/2020      Reactions   Gabapentin    Pregabalin       Medication List    TAKE these medications   acetaminophen 500 MG tablet Commonly known as: TYLENOL Take 1,000 mg by mouth 3 (three) times daily.   amLODipine 10 MG tablet Commonly known as: NORVASC Take 10 mg by mouth daily.   aspirin EC 81 MG tablet Take 81 mg by mouth every evening.   atorvastatin 40 MG tablet Commonly known as: LIPITOR Take 1 tablet (40 mg total) by mouth daily at 6 PM.   bacitracin-polymyxin b ointment Commonly known as: POLYSPORIN Apply 1 application topically daily. (apply to left shin)   CHAPSTICK EX Apply 1 application topically 3 (three) times daily as needed (chapped lips).   cholecalciferol 25 MCG (1000 UNIT) tablet Commonly known as: VITAMIN D3 Take 2,000 Units by mouth daily.   clopidogrel 75 MG tablet Commonly known as: PLAVIX Take 75 mg by mouth daily.   ICY HOT NO-MESS VAPOR GEL EX Apply 1 application topically 3 (three) times daily.   lisinopril 20 MG  tablet Commonly known as: ZESTRIL Take 20 mg by mouth daily.   metoprolol succinate 50 MG 24 hr tablet Commonly known as: TOPROL-XL Take 50 mg by mouth every evening.   potassium chloride SA 20 MEQ tablet Commonly known as: KLOR-CON Take 20 mEq by mouth 2 (two) times daily.   PRESCRIPTION MEDICATION Apply 0.5 mg topically every 4 (four) hours as needed (agitation). ** lorazepam 0.5mg / 0.36ml topical gel **   QUEtiapine 25 MG tablet Commonly known as: SEROQUEL Take 1 tablet (25 mg total) by mouth at bedtime.   risperiDONE 0.5 MG tablet Commonly  known as: RISPERDAL Take 0.5 mg by mouth 3 (three) times daily.   traMADol 50 MG tablet Commonly known as: ULTRAM Take 50 mg by mouth every 12 (twelve) hours as needed for moderate pain.   traZODone 50 MG tablet Commonly known as: DESYREL Take 1 tablet (50 mg total) by mouth at bedtime. What changed: how much to take   venlafaxine 75 MG tablet Commonly known as: EFFEXOR Take 1 tablet (75 mg total) by mouth 2 (two) times daily with a meal.   vitamin B-12 1000 MCG tablet Commonly known as: CYANOCOBALAMIN Take 1,000 mcg by mouth daily.      Follow-up Information    [facility] Follow up in 4 day(s).   Specialty: Geriatric Medicine Contact information: [place] [address] [phone]          Allergies  Allergen Reactions  . Gabapentin   . Pregabalin     Consultations:  PT, speech therapy   Procedures/Studies: CT Head Wo Contrast  Result Date: 03/09/2020 CLINICAL DATA:  Unwitnessed fall, head injury EXAM: CT HEAD WITHOUT CONTRAST TECHNIQUE: Contiguous axial images were obtained from the base of the skull through the vertex without intravenous contrast. COMPARISON:  01/07/2020 FINDINGS: Brain: No evidence of acute infarction, hemorrhage, hydrocephalus, extra-axial collection or mass lesion/mass effect. Global cortical and central atrophy. Subcortical white matter and periventricular small vessel ischemic changes. Vascular: Intracranial atherosclerosis. Skull: Normal. Negative for fracture or focal lesion. Sinuses/Orbits: The visualized paranasal sinuses are essentially clear. The mastoid air cells are unopacified. Other: None. IMPRESSION: No evidence of acute intracranial abnormality. Atrophy with small vessel ischemic changes. Electronically Signed   By: Julian Hy M.D.   On: 03/09/2020 20:13   CT CERVICAL SPINE W CONTRAST  Result Date: 03/10/2020 CLINICAL DATA:  Neck pain, acute, infection suspected. Fall.  Left-sided neck pain. EXAM: CT CERVICAL SPINE WITH CONTRAST TECHNIQUE: Multidetector CT imaging of the cervical spine was performed during intravenous contrast administration. Multiplanar CT image reconstructions were also generated. CONTRAST:  64mL OMNIPAQUE IOHEXOL 300 MG/ML  SOLN COMPARISON:  CT of the cervical spine 08/05/2019 FINDINGS: Alignment: Slight degenerative anterolisthesis at C4-5 at C7-T1 is stable. Alignment is otherwise anatomic. Skull base and vertebrae: No acute fracture. No primary bone lesion or focal pathologic process. No erosions are present. Changes at the fused level C5-6 and C6-7 are stable. Soft tissues and spinal canal: Soft tissues the neck are unremarkable. No prevertebral edema is present. No focal inflammatory change fluid collection is present. Disc levels: Solid fusion is present at C5-6 and C6-7. Uncovertebral spurring remains at C4-5 and C5-6. Multilevel facet degenerative changes are present, left greater than right. Upper chest: The lung apices are clear. Thoracic inlet is within normal limits. IMPRESSION: 1. No acute abnormality. 2. Stable fusion at C5-6 and C6-7. 3. Stable residual degenerative changes as described. 4. No evidence for acute inflammation or infection.  Electronically Signed   By: Marin Roberts M.D.   On: 03/10/2020 14:43   DG Chest Port 1 View  Result Date: 03/11/2020 CLINICAL DATA:  Fever EXAM: PORTABLE CHEST 1 VIEW COMPARISON:  March 09, 2020 FINDINGS: There is mild left base atelectasis. Lungs otherwise are clear. Heart is enlarged with pulmonary vascularity normal. No adenopathy. There is aortic atherosclerosis. There is calcification in the mitral annulus. Postoperative changes noted in the lower cervical spine. IMPRESSION: Mild left base atelectasis. Lungs elsewhere clear. Stable cardiomegaly. Aortic Atherosclerosis (ICD10-I70.0). Electronically Signed   By: Bretta Bang III M.D.   On: 03/11/2020 08:41   DG Chest Port 1 View  Result  Date: 03/09/2020 CLINICAL DATA:  Dyspnea, fall EXAM: PORTABLE CHEST 1 VIEW COMPARISON:  01/07/2020 chest radiograph. FINDINGS: Stable cardiomediastinal silhouette with normal heart size. No pneumothorax. No pleural effusion. Minimal bibasilar scarring versus atelectasis. No pulmonary edema. No acute consolidative airspace disease. No displaced fractures. Surgical clips overlie the lower cervical spine, unchanged. IMPRESSION: Minimal bibasilar scarring versus atelectasis. Electronically Signed   By: Delbert Phenix M.D.   On: 03/09/2020 18:18   ECHOCARDIOGRAM COMPLETE  Result Date: 03/10/2020    ECHOCARDIOGRAM REPORT   Patient Name:   TAMI BARREN Date of Exam: 03/10/2020 Medical Rec #:  [id number]    Height:       74.5 in Accession #:    [id number]   Weight:       153.6 lb Date of Birth:  January 28, 1933    BSA:          1.952 m Patient Age:    86 years     BP:           164/72 mmHg Patient Gender: M            HR:           55 bpm. Exam Location:  [facility] Procedure: 2D Echo, Color Doppler and Cardiac Doppler Indications:     R01.1 Murmur  History:         Patient has no prior history of Echocardiogram examinations.                  CAD; Risk Factors:Hypertension. Dementia.  Sonographer:     Humphrey Rolls RDCS (AE) Referring Phys:  [id number] Vernetta Honey MANSY Diagnosing Phys: Alwyn Pea MD IMPRESSIONS  1. Left ventricular ejection fraction, by estimation, is 55 to 60%. The left ventricle has normal function. The left ventricle demonstrates regional wall motion abnormalities (see scoring diagram/findings for description). Left ventricular diastolic parameters are consistent with Grade I diastolic dysfunction (impaired relaxation).  2. Right ventricular systolic function is normal. The right ventricular size is normal. There is moderately elevated pulmonary artery systolic pressure.  3. The mitral valve is grossly normal. Mild mitral valve regurgitation.  4. The aortic valve is normal in structure. Aortic valve regurgitation is  not visualized. FINDINGS  Left Ventricle: Left ventricular ejection fraction, by estimation, is 55 to 60%. The left ventricle has normal function. The left ventricle demonstrates regional wall motion abnormalities. The left ventricular internal cavity size was normal in size. There is no left ventricular hypertrophy. Left ventricular diastolic parameters are consistent with Grade I diastolic dysfunction (impaired relaxation). Right Ventricle: The right ventricular size is normal. No increase in right ventricular wall thickness. Right ventricular systolic function is normal. There is moderately elevated pulmonary artery systolic pressure. The tricuspid regurgitant velocity is 2.76 m/s, and with an assumed right atrial pressure of 10 mmHg, the  estimated right ventricular systolic pressure is 40.5 mmHg. Left Atrium: Left atrial size was normal in size. Right Atrium: Right atrial size was normal in size. Pericardium: There is no evidence of pericardial effusion. Mitral Valve: The mitral valve is grossly normal. There is mild thickening of the mitral valve leaflet(s). Normal mobility of the mitral valve leaflets. Moderate mitral annular calcification. Mild mitral valve regurgitation. MV peak gradient, 5.1 mmHg. The mean mitral valve gradient is 1.0 mmHg. Tricuspid Valve: The tricuspid valve is normal in structure. Tricuspid valve regurgitation is mild. Aortic Valve: The aortic valve is normal in structure. Aortic valve regurgitation is not visualized. Aortic valve mean gradient measures 5.0 mmHg. Aortic valve peak gradient measures 10.8 mmHg. Aortic valve area, by VTI measures 3.31 cm. Pulmonic Valve: The pulmonic valve was normal in structure. Pulmonic valve regurgitation is not visualized. Aorta: The aortic root is normal in size and structure. IAS/Shunts: The atrial septum is grossly normal.  LEFT VENTRICLE PLAX 2D LVIDd:         4.83 cm  Diastology LVIDs:         2.74 cm  LV e' lateral:   8.38 cm/s LV PW:          1.05 cm  LV E/e' lateral: 10.6 LV IVS:        0.74 cm  LV e' medial:    6.42 cm/s LVOT diam:     2.40 cm  LV E/e' medial:  13.9 LV SV:         108 LV SV Index:   55 LVOT Area:     4.52 cm  RIGHT VENTRICLE RV Basal diam:  3.33 cm LEFT ATRIUM           Index       RIGHT ATRIUM           Index LA diam:      2.80 cm 1.43 cm/m  RA Area:     17.10 cm LA Vol (A4C): 37.6 ml 19.26 ml/m RA Volume:   43.80 ml  22.44 ml/m  AORTIC VALVE                    PULMONIC VALVE AV Area (Vmax):    3.17 cm     PV Vmax:       0.96 m/s AV Area (Vmean):   3.05 cm     PV Vmean:      62.800 cm/s AV Area (VTI):     3.31 cm     PV VTI:        0.188 m AV Vmax:           164.00 cm/s  PV Peak grad:  3.7 mmHg AV Vmean:          100.000 cm/s PV Mean grad:  2.0 mmHg AV VTI:            0.327 m AV Peak Grad:      10.8 mmHg AV Mean Grad:      5.0 mmHg LVOT Vmax:         115.00 cm/s LVOT Vmean:        67.500 cm/s LVOT VTI:          0.239 m LVOT/AV VTI ratio: 0.73  AORTA Ao Root diam: 3.60 cm MITRAL VALVE                TRICUSPID VALVE MV Area (PHT): 3.08 cm     TR Peak grad:   30.5 mmHg  MV Peak grad:  5.1 mmHg     TR Vmax:        276.00 cm/s MV Mean grad:  1.0 mmHg MV Vmax:       1.13 m/s     SHUNTS MV Vmean:      54.3 cm/s    Systemic VTI:  0.24 m MV Decel Time: 246 msec     Systemic Diam: 2.40 cm MV E velocity: 89.20 cm/s MV A velocity: 121.00 cm/s MV E/A ratio:  0.74 Dwayne D Callwood MD Electronically signed by Alwyn Peawayne D Callwood MD Signature Date/Time: 03/10/2020/12:45:19 PM    Final        Subjective: Sleepy this a.m. but finally opens his eyes when told his daughter brought him cookies and [employer].  And then he shuts his eyes closed again.  Denies any quick complaints  Discharge Exam: Vitals:   03/12/20 0200 03/12/20 0753  BP:  (!) 162/82  Pulse:  66  Resp:  15  Temp: 98.8 F (37.1 C) 98.6 F (37 C)  SpO2:     Vitals:   03/11/20 0022 03/12/20 0021 03/12/20 0200 03/12/20 0753  BP: (!) 174/63 (!) 140/98  (!) 162/82   Pulse: 61 61  66  Resp:  18  15  Temp: 98 F (36.7 C)  98.8 F (37.1 C) 98.6 F (37 C)  TempSrc: Oral  Axillary Oral  SpO2:  97%    Weight:      Height:        General: NAD Cardiovascular: RRR, S1/S2 +, no rubs, no gallops Respiratory: CTA bilaterally, no wheezing, no rhonchi Abdominal: Soft, NT, ND, bowel sounds + Extremities: no edema, no cyanosis    The results of significant diagnostics from this hospitalization (including imaging, microbiology, ancillary and laboratory) are listed below for reference.     Microbiology: Recent Results (from the past 240 hour(s))  Blood Culture (routine x 2)     Status: None (Preliminary result)   Collection Time: 03/09/20  5:48 PM   Specimen: BLOOD  Result Value Ref Range Status   Specimen Description BLOOD LEFT ANTECUBITAL  Final   Special Requests   Final    BOTTLES DRAWN AEROBIC AND ANAEROBIC Blood Culture adequate volume   Culture   Final    NO GROWTH 3 DAYS Performed at [facility], [address] [place], [place] [address]    Report Status PENDING  Incomplete  Blood Culture (routine x 2)     Status: None (Preliminary result)   Collection Time: 03/09/20  5:48 PM   Specimen: BLOOD  Result Value Ref Range Status   Specimen Description BLOOD BLOOD LEFT HAND  Final   Special Requests   Final    BOTTLES DRAWN AEROBIC AND ANAEROBIC Blood Culture results may not be optimal due to an inadequate volume of blood received in culture bottles   Culture   Final    NO GROWTH 3 DAYS Performed at [facility], [address] [place], [place] [address]    Report Status PENDING  Incomplete  Respiratory Panel by RT PCR (Flu A&B, Covid) - Nasopharyngeal Swab     Status: None   Collection Time: 03/09/20  6:31 PM   Specimen: Nasopharyngeal Swab  Result Value Ref Range Status   SARS Coronavirus 2 by RT PCR NEGATIVE NEGATIVE Final    Comment: (NOTE) SARS-CoV-2 target nucleic acids are NOT DETECTED. The SARS-CoV-2 RNA  is generally detectable in upper respiratoy specimens during the acute phase of infection.  The lowest concentration of SARS-CoV-2 viral copies this assay can detect is 131 copies/mL. A negative result does not preclude SARS-Cov-2 infection and should not be used as the sole basis for treatment or other patient management decisions. A negative result may occur with  improper specimen collection/handling, submission of specimen other than nasopharyngeal swab, presence of viral mutation(s) within the areas targeted by this assay, and inadequate number of viral copies (<131 copies/mL). A negative result must be combined with clinical observations, patient history, and epidemiological information. The expected result is Negative. Fact Sheet for Patients:  https://www.moore.com/ Fact Sheet for Healthcare Providers:  https://www.young.biz/ This test is not yet ap proved or cleared by the [place] FDA and  has been authorized for detection and/or diagnosis of SARS-CoV-2 by FDA under an Emergency Use Authorization (EUA). This EUA will remain  in effect (meaning this test can be used) for the duration of the COVID-19 declaration under Section 564(b)(1) of the Act, 21 U.S.C. section 360bbb-3(b)(1), unless the authorization is terminated or revoked sooner.    Influenza A by PCR NEGATIVE NEGATIVE Final   Influenza B by PCR NEGATIVE NEGATIVE Final    Comment: (NOTE) The Xpert Xpress SARS-CoV-2/FLU/RSV assay is intended as an aid in  the diagnosis of influenza from Nasopharyngeal swab specimens and  should not be used as a sole basis for treatment. Nasal washings and  aspirates are unacceptable for Xpert Xpress SARS-CoV-2/FLU/RSV  testing. Fact Sheet for Patients: https://www.moore.com/ Fact Sheet for Healthcare Providers: https://www.young.biz/ This test is not yet approved or cleared by the [place] FDA and   has been authorized for detection and/or diagnosis of SARS-CoV-2 by  FDA under an Emergency Use Authorization (EUA). This EUA will remain  in effect (meaning this test can be used) for the duration of the  Covid-19 declaration under Section 564(b)(1) of the Act, 21  U.S.C. section 360bbb-3(b)(1), unless the authorization is  terminated or revoked. Performed at [facility], [address] [place], [place] [address]   Urine culture     Status: None   Collection Time: 03/09/20  8:36 PM   Specimen: In/Out Cath Urine  Result Value Ref Range Status   Specimen Description   Final    IN/OUT CATH URINE Performed at [facility], [address], [place], [place] [address]    Special Requests   Final    NONE Performed at [facility], [address], [place], [place] [address]    Culture   Final    NO GROWTH Performed at [facility], [address], [place], [place] [address]    Report Status 03/10/2020 FINAL  Final  MRSA PCR Screening     Status: None   Collection Time: 03/10/20 12:58 PM   Specimen: Nasal Mucosa; Nasopharyngeal  Result Value Ref Range Status   MRSA by PCR NEGATIVE NEGATIVE Final    Comment:        The GeneXpert MRSA Assay (FDA approved for NASAL specimens only), is one component of a comprehensive MRSA colonization surveillance program. It is not intended to diagnose MRSA infection nor to guide or monitor treatment for MRSA infections. Performed at [facility], [address] [place], [place] [address]      Labs: BNP (last 3 results) No results for input(s): BNP in the last 8760 hours. Basic Metabolic Panel: Recent Labs  Lab 03/09/20 1748 03/10/20 0613 03/11/20 0556 03/12/20 0552  NA 139 139 137 142  K 4.1 2.9* 3.2* 3.1*  CL 105 109 101 106  CO2 26 25 27 28   GLUCOSE 127* 104* 109* 92  BUN 22 13 10 12   CREATININE 0.96 0.58* 0.58* 0.83  CALCIUM 8.6* 7.6* 8.9 8.6*  MG  --   --  1.9  --    Liver  Function Tests: Recent Labs  Lab 03/09/20 1748  AST 20  ALT 12  ALKPHOS 52  BILITOT 0.8  PROT 6.0*  ALBUMIN 3.5   No results for input(s): LIPASE, AMYLASE in the last 168 hours. No results for input(s): AMMONIA in the last 168 hours. CBC: Recent Labs  Lab 03/09/20 1748 03/10/20 0613 03/11/20 0556 03/12/20 0552  WBC 7.3 4.6  --  5.9  NEUTROABS 6.6  --   --   --   HGB 11.5* 9.0* 11.3* 11.4*  HCT 35.6* 27.8*  --  34.6*  MCV 102.9* 101.8*  --  100.0  PLT 225 168  --  222   Cardiac Enzymes: No results for input(s): CKTOTAL, CKMB, CKMBINDEX, TROPONINI in the last 168 hours. BNP: Invalid input(s): POCBNP CBG: No results for input(s): GLUCAP in the last 168 hours. D-Dimer No results for input(s): DDIMER in the last 72 hours. Hgb A1c No results for input(s): HGBA1C in the last 72 hours. Lipid Profile No results for input(s): CHOL, HDL, LDLCALC, TRIG, CHOLHDL, LDLDIRECT in the last 72 hours. Thyroid function studies No results for input(s): TSH, T4TOTAL, T3FREE, THYROIDAB in the last 72 hours.  Invalid input(s): FREET3 Anemia work up No results for input(s): VITAMINB12, FOLATE, FERRITIN, TIBC, IRON, RETICCTPCT in the last 72 hours. Urinalysis    Component Value Date/Time   COLORURINE YELLOW (A) 03/09/2020 2036   APPEARANCEUR CLEAR (A) 03/09/2020 2036   LABSPEC 1.025 03/09/2020 2036   PHURINE 5.0 03/09/2020 2036   GLUCOSEU NEGATIVE 03/09/2020 2036   HGBUR NEGATIVE 03/09/2020 2036   BILIRUBINUR NEGATIVE 03/09/2020 2036   KETONESUR NEGATIVE 03/09/2020 2036   PROTEINUR NEGATIVE 03/09/2020 2036   NITRITE NEGATIVE 03/09/2020 2036   LEUKOCYTESUR NEGATIVE 03/09/2020 2036   Sepsis Labs Invalid input(s): PROCALCITONIN,  WBC,  LACTICIDVEN Microbiology Recent Results (from the past 240 hour(s))  Blood Culture (routine x 2)     Status: None (Preliminary result)   Collection Time: 03/09/20  5:48 PM   Specimen: BLOOD  Result Value Ref Range Status   Specimen Description  BLOOD LEFT ANTECUBITAL  Final   Special Requests   Final    BOTTLES DRAWN AEROBIC AND ANAEROBIC Blood Culture adequate volume   Culture   Final    NO GROWTH 3 DAYS Performed at [facility], [address], [place], [address] [place]    Report Status PENDING  Incomplete  Blood Culture (routine x 2)     Status: None (Preliminary result)   Collection Time: 03/09/20  5:48 PM   Specimen: BLOOD  Result Value Ref Range Status   Specimen Description BLOOD BLOOD LEFT HAND  Final   Special Requests   Final    BOTTLES DRAWN AEROBIC AND ANAEROBIC Blood Culture results may not be optimal due to an inadequate volume of blood received in culture bottles   Culture   Final    NO GROWTH 3 DAYS Performed at [facility], [address] [place], [address] [place]    Report Status PENDING  Incomplete  Respiratory Panel by RT PCR (Flu A&B, Covid) - Nasopharyngeal Swab     Status: None   Collection Time: 03/09/20  6:31 PM   Specimen: Nasopharyngeal Swab  Result Value Ref Range Status   SARS Coronavirus 2 by RT PCR NEGATIVE NEGATIVE Final    Comment: (NOTE) SARS-CoV-2 target nucleic acids are NOT DETECTED. The SARS-CoV-2 RNA is generally detectable in upper respiratoy specimens during the acute phase of infection. The lowest concentration of SARS-CoV-2 viral copies this assay can detect is 131 copies/mL. A negative result does not preclude SARS-Cov-2 infection and should not be used as the sole basis for treatment or other patient management decisions. A negative result may occur with  improper specimen collection/handling, submission of specimen other than nasopharyngeal swab, presence of viral mutation(s) within the areas targeted by this assay, and inadequate number of viral copies (<131 copies/mL). A negative result must be combined with clinical observations, patient history, and epidemiological information. The expected result is Negative. Fact Sheet for Patients:   https://www.moore.com/ Fact Sheet for Healthcare Providers:  https://www.young.biz/ This test is not yet ap proved or cleared by the [place] FDA and  has been authorized for detection and/or diagnosis of SARS-CoV-2 by FDA under an Emergency Use Authorization (EUA). This EUA will remain  in effect (meaning this test can be used) for the duration of the COVID-19 declaration under Section 564(b)(1) of the Act, 21 U.S.C. section 360bbb-3(b)(1), unless the authorization is terminated or revoked sooner.    Influenza A by PCR NEGATIVE NEGATIVE Final   Influenza B by PCR NEGATIVE NEGATIVE Final    Comment: (NOTE) The Xpert Xpress SARS-CoV-2/FLU/RSV assay is intended as an aid in  the diagnosis of influenza from Nasopharyngeal swab specimens and  should not be used as a sole basis for treatment. Nasal washings and  aspirates are unacceptable for Xpert Xpress SARS-CoV-2/FLU/RSV  testing. Fact Sheet for Patients: https://www.moore.com/ Fact Sheet for Healthcare Providers: https://www.young.biz/ This test is not yet approved or cleared by the [place] FDA and  has been authorized for detection and/or diagnosis of SARS-CoV-2 by  FDA under an Emergency Use Authorization (EUA). This EUA will remain  in effect (meaning this test can be used) for the duration of the  Covid-19 declaration under Section 564(b)(1) of the Act, 21  U.S.C. section 360bbb-3(b)(1), unless the authorization is  terminated or revoked. Performed at [facility], [address] [place], [place] [address]   Urine culture     Status: None   Collection Time: 03/09/20  8:36 PM   Specimen: In/Out Cath Urine  Result Value Ref Range Status   Specimen Description   Final    IN/OUT CATH URINE Performed at [facility], [address] [place], [place] [address]    Special Requests   Final    NONE Performed at [facility], [address], [place], [place] [address]    Culture   Final    NO GROWTH Performed at [facility], [address] [place], [place] [address]    Report Status 03/10/2020 FINAL  Final  MRSA PCR Screening     Status: None   Collection Time: 03/10/20 12:58 PM   Specimen: Nasal Mucosa; Nasopharyngeal  Result Value Ref Range Status   MRSA by PCR NEGATIVE NEGATIVE Final    Comment:        The GeneXpert MRSA Assay (FDA approved for NASAL specimens only), is one component of a comprehensive MRSA colonization surveillance program. It is not intended to diagnose MRSA infection nor to guide or monitor treatment for MRSA infections. Performed at [facility], [address], [place], [place] [address]  1. Acute delirium.Patient on Risperdal during the day. Added Seroquel at night.  Doing better seems to be at baseline 2. Suspected sepsis but unclear cause.  Fever of 102 by EMS. Temperature curve has come down. Procalcitonin negative x2. De-escalated antibiotics. Repeat chest x-ray negative. So far cultures negative. Wondering if dehydration could play a role with the fever.  Since no growth on cultures will discontinue 3. Hypokalemia -replaced this a.m. with KCl 40 mEq x 1.  Will need to have BMP checked in 1 to 2 days at the memory center. 4. Lactic acidosis improved with fluid 5. Dementia with behavioral disturbance continue risperidone and trazodone. Try to give Seroquel at night. 6. Essential hypertension on lisinopril and Norvasc 7. Hyperlipidemia unspecified on atorvastatin 8. History of CAD on aspirin, Plavix, metoprolol and atorvastatin 9. Physical therapy evaluation 10. Unwitnessed fall and neck pain. CT scan of the cervical spine negative for infection or fracture.  Time coordinating discharge: Over 30 minutes  SIGNED:   Lynn Ito, MD  [facility] 03/12/2020, 12:19 PM Pager   If 7PM-7AM, please contact  night-coverage [email or web address] Password [email or web address]

## 2020-03-12 NOTE — Care Management Important Message (Signed)
Important Message  Patient Details  Name: Glenn Barker MRN: [id number] Date of Birth: [birth year]   Medicare Important Message Given:  Yes     Bernadette Hoit 03/12/2020, 11:08 AM

## 2020-03-12 NOTE — Evaluation (Signed)
Physical Therapy Evaluation Patient Details Name: Glenn Barker MRN: [id number] DOB: 1933-05-29 Today's Date: 03/12/2020   History of Present Illness  Pt admitted for sepsis with complaints of unwitnessed fall. Currently lives at memory care unit. History includes HTN, dementia, and CAD.  Clinical Impression  Pt is a pleasant 86 year old pleasantly confused male who was admitted for sepsis. Pt is very sleepy and takes excessive cues to keep eyes open throughout evaluation. Pt performs bed mobility/transfers with min assist too fatigued to ambulate this date. Pt demonstrates deficits with cognition/strength/balance. Would benefit from skilled PT to address above deficits and promote optimal return to PLOF. Currently recommend return back to memory care unit as this is a familiar environment and highly doubtful he will be able to demonstrate carry over to perform PT in a SNF environment.    Follow Up Recommendations (return back to memory care unit)    Equipment Recommendations  (TBD)    Recommendations for Other Services       Precautions / Restrictions Precautions Precautions: Fall Restrictions Weight Bearing Restrictions: No      Mobility  Bed Mobility Overal bed mobility: Needs Assistance Bed Mobility: Supine to Sit     Supine to sit: Min assist     General bed mobility comments: unable to initiate any movement towards EOB. Once therapist faciliates, he is able to transfer over to EOB. Once seated, able to maintain upright posture  Transfers Overall transfer level: Needs assistance Equipment used: Rolling walker (2 wheeled);None Transfers: Sit to/from Stand Sit to Stand: Min assist         General transfer comment: first few attempts without AD, Keeps eyes closed during attempt and demonstrates heavy post lean needing mod assist to prevent LOB. Additional attempts performed with RW with improved static balance. Still keeps eyes closed  Ambulation/Gait              General Gait Details: attempted ambulation, however unable to process marching or pre gait activities. Pt still very lethargic, unable to further progress mobility due to cognition/lethargy.  Stairs            Wheelchair Mobility    Modified Rankin (Stroke Patients Only)       Balance Overall balance assessment: History of Falls;Needs assistance Sitting-balance support: Feet supported;Bilateral upper extremity supported Sitting balance-Leahy Scale: Fair     Standing balance support: Bilateral upper extremity supported Standing balance-Leahy Scale: Poor Standing balance comment: post lean with no internal initiaion to correct. Needs external assist                             Pertinent Vitals/Pain Pain Assessment: No/denies pain    Home Living Family/patient expects to be discharged to:: Assisted living(memory care unit)                 Additional Comments: unsure of equipment    Prior Function           Comments: Per care management pt ambulatory with walker.     Hand Dominance        Extremity/Trunk Assessment   Upper Extremity Assessment Upper Extremity Assessment: Generalized weakness;Difficult to assess due to impaired cognition(appears grossly 3/5)    Lower Extremity Assessment Lower Extremity Assessment: Generalized weakness;Difficult to assess due to impaired cognition(appears grossly 3/5)       Communication   Communication: No difficulties  Cognition Arousal/Alertness: Lethargic Behavior During Therapy: WFL for tasks assessed/performed Overall Cognitive  Status: History of cognitive impairments - at baseline                                 General Comments: pt very lethargic keeps eyes closed during session.      General Comments      Exercises Other Exercises Other Exercises: Assisted pt in donning/doffing socks/shoes. Handed pt sock, however pt showes no attempt to don himself. Needs total assist  for donning.  Other Exercises: performed several sit<>Stands with/without AD. Needs heavy cues for balance and posture.   Assessment/Plan    PT Assessment Patient needs continued PT services  PT Problem List Decreased strength;Decreased activity tolerance;Decreased balance;Decreased mobility;Decreased cognition;Decreased safety awareness       PT Treatment Interventions Gait training;Therapeutic exercise;Balance training;DME instruction    PT Goals (Current goals can be found in the Care Plan section)  Acute Rehab PT Goals Patient Stated Goal: unable to state PT Goal Formulation: Patient unable to participate in goal setting Time For Goal Achievement: 03/26/20 Potential to Achieve Goals: Fair    Frequency Min 2X/week   Barriers to discharge        Co-evaluation               AM-PAC PT "6 Clicks" Mobility  Outcome Measure Help needed turning from your back to your side while in a flat bed without using bedrails?: A Lot Help needed moving from lying on your back to sitting on the side of a flat bed without using bedrails?: A Lot Help needed moving to and from a bed to a chair (including a wheelchair)?: Total Help needed standing up from a chair using your arms (e.g., wheelchair or bedside chair)?: Total Help needed to walk in hospital room?: Total Help needed climbing 3-5 steps with a railing? : Total 6 Click Score: 8    End of Session Equipment Utilized During Treatment: Gait belt Activity Tolerance: Patient limited by lethargy Patient left: in bed;with nursing/sitter in room Nurse Communication: Mobility status PT Visit Diagnosis: Muscle weakness (generalized) (M62.81);Difficulty in walking, not elsewhere classified (R26.2);Unsteadiness on feet (R26.81)    Time: 5170-0174 PT Time Calculation (min) (ACUTE ONLY): 15 min   Charges:   PT Evaluation $PT Eval Low Complexity: 1 Low PT Treatments $Therapeutic Activity: 8-22 mins        Greggory Stallion, PT,  DPT [phone]   Breon Diss 03/12/2020, 11:13 AM

## 2020-03-12 NOTE — NC FL2 (Signed)
[place] MEDICAID FL2 LEVEL OF CARE SCREENING TOOL     IDENTIFICATION  Patient Name: Glenn Barker Birthdate: 04-26-1933 Sex: male Admission Date (Current Location): 03/09/2020  [place] and [place] Number:  Chiropodist and Address:  [facility], [address], [place], [place] [address]      Provider Number: [id number]  Attending Physician Name and Address:  Lynn Ito, MD  Relative Name and Phone Number:  Nabor Thomann [phone]    Current Level of Care: Hospital Recommended Level of Care: Memory Care Prior Approval Number:    Date Approved/Denied:   PASRR Number:    Discharge Plan: Other (Comment)(Memory Care)    Current Diagnoses: Patient Active Problem List   Diagnosis Date Noted  . Acute delirium   . Unwitnessed fall   . Neck pain   . Hypokalemia   . Acute metabolic encephalopathy 01/09/2020  . Lactic acidosis   . Sepsis (HCC) 01/07/2020  . Cellulitis of right lower extremity   . RUQ pain   . Dementia with behavioral disturbance (HCC)   . Essential hypertension     Orientation RESPIRATION BLADDER Height & Weight     Self  Normal Continent Weight: 153 lb 9.6 oz (69.7 kg) Height:  6' 2.5" (189.2 cm)  BEHAVIORAL SYMPTOMS/MOOD NEUROLOGICAL BOWEL NUTRITION STATUS      Continent Diet(Soft-pureed thin liquds)  AMBULATORY STATUS COMMUNICATION OF NEEDS Skin   Independent Verbally Normal                       Personal Care Assistance Level of Assistance  Bathing, Dressing Bathing Assistance: Limited assistance   Dressing Assistance: Limited assistance     Functional Limitations Info  Speech     Speech Info: Impaired    SPECIAL CARE FACTORS FREQUENCY                       Contractures Contractures Info: Not present    Additional Factors Info  Code Status, Allergies Code Status Info: DNR Allergies Info: Gabapentin, Pregabulin           Current Medications (03/12/2020):  This is the  current hospital active medication list Current Facility-Administered Medications  Medication Dose Route Frequency Provider Last Rate Last Admin  . 0.9 % NaCl with KCl 20 mEq/ L  infusion   Intravenous Continuous Alford Highland, MD 50 mL/hr at 03/12/20 0547 New Bag at 03/12/20 0547  . acetaminophen (TYLENOL) tablet 650 mg  650 mg Oral Q6H PRN Mansy, Jan A, MD       Or  . acetaminophen (TYLENOL) suppository 650 mg  650 mg Rectal Q6H PRN Mansy, Jan A, MD      . amLODipine (NORVASC) tablet 10 mg  10 mg Oral Daily Mansy, Jan A, MD   10 mg at 03/12/20 1011  . aspirin EC tablet 81 mg  81 mg Oral QPM Mansy, Jan A, MD   81 mg at 03/10/20 1636  . atorvastatin (LIPITOR) tablet 40 mg  40 mg Oral q1800 Mansy, Jan A, MD   40 mg at 03/10/20 1636  . ceFEPIme (MAXIPIME) 2 g in sodium chloride 0.9 % 100 mL IVPB  2 g Intravenous Q8H Marty Heck, RPH 200 mL/hr at 03/12/20 0548 2 g at 03/12/20 0548  . cholecalciferol (VITAMIN D3) tablet 2,000 Units  2,000 Units Oral Daily Mansy, Jan A, MD   2,000 Units at 03/12/20 1011  . clopidogrel (PLAVIX) tablet 75 mg  75 mg Oral Daily Mansy, Jan A, MD   75 mg at 03/12/20 1011  . enoxaparin (LOVENOX) injection 40 mg  40 mg Subcutaneous Q24H Mansy, Jan A, MD   40 mg at 03/11/20 2159  . haloperidol lactate (HALDOL) injection 2 mg  2 mg Intravenous Q6H PRN Sharion Settler, NP      . lip balm (BLISTEX) ointment   Topical TID PRN Mansy, Jan A, MD      . lisinopril (ZESTRIL) tablet 20 mg  20 mg Oral Daily Mansy, Jan A, MD   20 mg at 03/12/20 1011  . magnesium hydroxide (MILK OF MAGNESIA) suspension 30 mL  30 mL Oral Daily PRN Mansy, Jan A, MD      . metoprolol succinate (TOPROL-XL) 24 hr tablet 50 mg  50 mg Oral QPM Mansy, Jan A, MD   50 mg at 03/10/20 1636  . ondansetron (ZOFRAN) tablet 4 mg  4 mg Oral Q6H PRN Mansy, Jan A, MD       Or  . ondansetron [facility]) injection 4 mg  4 mg Intravenous Q6H PRN Mansy, Jan A, MD      . QUEtiapine (SEROQUEL) tablet 25 mg  25 mg Oral QHS  Loletha Grayer, MD   25 mg at 03/11/20 2159  . risperiDONE (RISPERDAL) tablet 0.5 mg  0.5 mg Oral TID Mansy, Jan A, MD   0.5 mg at 03/12/20 1011  . traMADol (ULTRAM) tablet 50 mg  50 mg Oral Q12H PRN Mansy, Jan A, MD      . traZODone (DESYREL) tablet 75 mg  75 mg Oral QHS Mansy, Jan A, MD   75 mg at 03/11/20 2200  . Triple Antibiotic [id number] OINT 1 application  1 application Topical Daily Mansy, Jan A, MD   1 application at 12/75/17 1012  . venlafaxine (EFFEXOR) tablet 75 mg  75 mg Oral BID WC Mansy, Jan A, MD   75 mg at 03/12/20 1010  . vitamin B-12 (CYANOCOBALAMIN) tablet 1,000 mcg  1,000 mcg Oral Daily Mansy, Jan A, MD   1,000 mcg at 03/12/20 1011     Discharge Medications: Please see discharge summary for a list of discharge medications.  Relevant Imaging Results:  Relevant Lab Results:   Additional Information SSN: [id number]  Adwoa Axe, Gardiner Rhyme, LCSW

## 2020-03-12 NOTE — TOC Progression Note (Signed)
Transition of Care [facility]) - Progression Note    Patient Details  Name: Glenn Barker MRN: [id number] Date of Birth: [birth year]  [facility] CM/SW Contact  Nou Chard, Lemar Livings, LCSW Phone Number: 03/12/2020, 12:54 PM  Clinical Narrative: Attempted to fax information to [facility] their fax is done so told to send paperwork in packet. Have done so and contacted EMS for transport      Expected Discharge Plan: Memory Care Barriers to Discharge: Barriers Resolved  Expected Discharge Plan and Services Expected Discharge Plan: Memory Care In-house Referral: Clinical Social Work     Living arrangements for the past 2 months: Assisted Living Facility Expected Discharge Date: 03/12/20                                     Social Determinants of Health (SDOH) Interventions    Readmission Risk Interventions No flowsheet data found.

## 2020-03-14 LAB — CULTURE, BLOOD (ROUTINE X 2)
Culture: NO GROWTH
Culture: NO GROWTH
Special Requests: ADEQUATE

## 2020-05-31 ENCOUNTER — Emergency Department

## 2020-05-31 ENCOUNTER — Emergency Department
Admission: EM | Admit: 2020-05-31 | Discharge: 2020-05-31 | Disposition: A | Discharge status: Home / Self Care | Attending: Emergency Medicine | Admitting: Emergency Medicine

## 2020-05-31 ENCOUNTER — Other Ambulatory Visit: Payer: Self-pay

## 2020-05-31 DIAGNOSIS — Z79899 Other long term (current) drug therapy: Secondary | ICD-10-CM | POA: Diagnosis not present

## 2020-05-31 DIAGNOSIS — Y939 Activity, unspecified: Secondary | ICD-10-CM | POA: Diagnosis not present

## 2020-05-31 DIAGNOSIS — Z7901 Long term (current) use of anticoagulants: Secondary | ICD-10-CM | POA: Insufficient documentation

## 2020-05-31 DIAGNOSIS — F0390 Unspecified dementia without behavioral disturbance: Secondary | ICD-10-CM | POA: Insufficient documentation

## 2020-05-31 DIAGNOSIS — I2510 Atherosclerotic heart disease of native coronary artery without angina pectoris: Secondary | ICD-10-CM | POA: Diagnosis not present

## 2020-05-31 DIAGNOSIS — Z7982 Long term (current) use of aspirin: Secondary | ICD-10-CM | POA: Insufficient documentation

## 2020-05-31 DIAGNOSIS — Y92129 Unspecified place in nursing home as the place of occurrence of the external cause: Secondary | ICD-10-CM | POA: Diagnosis not present

## 2020-05-31 DIAGNOSIS — M79642 Pain in left hand: Secondary | ICD-10-CM | POA: Diagnosis not present

## 2020-05-31 DIAGNOSIS — I10 Essential (primary) hypertension: Secondary | ICD-10-CM | POA: Insufficient documentation

## 2020-05-31 DIAGNOSIS — W050XXA Fall from non-moving wheelchair, initial encounter: Secondary | ICD-10-CM | POA: Insufficient documentation

## 2020-05-31 DIAGNOSIS — Y999 Unspecified external cause status: Secondary | ICD-10-CM | POA: Insufficient documentation

## 2020-05-31 DIAGNOSIS — R519 Headache, unspecified: Secondary | ICD-10-CM | POA: Insufficient documentation

## 2020-05-31 DIAGNOSIS — W19XXXA Unspecified fall, initial encounter: Secondary | ICD-10-CM

## 2020-05-31 NOTE — ED Notes (Signed)
E signature pad not working. Pt educated on discharge instructions and verbalized understanding. Pt gave verbal consent for transport back to [facility]. [facility] contacted and report given to nurse on duty.

## 2020-05-31 NOTE — ED Triage Notes (Signed)
Pt from [facility], fell from wheelchair. Pt hit face and head. Pt also with c/o left arm pain. Pt with swollen knuckles on left hand. Pt under hospice care, pt unable to state why. Pt alert, disoriented to place and date, somewhat oriented to situation.

## 2020-05-31 NOTE — ED Provider Notes (Signed)
[facility] Emergency Department Provider Note  ____________________________________________   First MD Initiated Contact with Patient 05/31/20 2004     (approximate)  I have reviewed the triage vital signs and the nursing notes.   HISTORY  Chief Complaint Fall    HPI Glenn Barker is a 86 y.o. male   with past medical history of dementia here with fall. Pt reportedly is wheelchair bound, on hospice care. He reportedly attempted to get out of his wheelchair and accidentally fell forward, striking his head and his left hand. He c/o pain at the time. Hospice RN evaluated and asked that pt be evaluated in the ED for head injury. Pt is demented and at his mental baseline. No other injuries. No lacerations.  Level 5 caveat invoked as remainder of history, ROS, and physical exam limited due to patient's dementia.        Past Medical History:  Diagnosis Date  . Coronary artery disease   . Dementia ([place])   . Hypertension     Patient Active Problem List   Diagnosis Date Noted  . Acute delirium   . Unwitnessed fall   . Neck pain   . Hypokalemia   . Acute metabolic encephalopathy 63/78/5885  . Lactic acidosis   . Sepsis ([place]) 01/07/2020  . Cellulitis of right lower extremity   . RUQ pain   . Dementia with behavioral disturbance ([place])   . Essential hypertension     Past Surgical History:  Procedure Laterality Date  . NO PAST SURGERIES      Prior to Admission medications   Medication Sig Start Date End Date Taking? Authorizing Provider  acetaminophen (TYLENOL) 500 MG tablet Take 1,000 mg by mouth 3 (three) times daily.    [provider]  amLODipine (NORVASC) 10 MG tablet Take 10 mg by mouth daily.    [provider]  aspirin EC 81 MG tablet Take 81 mg by mouth every evening.    [provider]  atorvastatin (LIPITOR) 40 MG tablet Take 1 tablet (40 mg total) by mouth daily at 6 PM. 01/11/20   Ezekiel Slocumb, DO    bacitracin-polymyxin b (POLYSPORIN) ointment Apply 1 application topically daily. (apply to left shin)    [provider]  Camphor-Eucalyptus-Menthol (ICY HOT NO-MESS VAPOR GEL EX) Apply 1 application topically 3 (three) times daily.    [provider]  cholecalciferol (VITAMIN D3) 25 MCG (1000 UT) tablet Take 2,000 Units by mouth daily.    [provider]  clopidogrel (PLAVIX) 75 MG tablet Take 75 mg by mouth daily.    [provider]  lisinopril (ZESTRIL) 20 MG tablet Take 20 mg by mouth daily.    [provider]  metoprolol succinate (TOPROL-XL) 50 MG 24 hr tablet Take 50 mg by mouth every evening.    [provider]  potassium chloride SA (K-DUR) 20 MEQ tablet Take 20 mEq by mouth 2 (two) times daily.    [provider]  PRESCRIPTION MEDICATION Apply 0.5 mg topically every 4 (four) hours as needed (agitation). ** lorazepam 0.5mg / 0.45ml topical gel **    [provider]  QUEtiapine (SEROQUEL) 25 MG tablet Take 1 tablet (25 mg total) by mouth at bedtime. 03/12/20   Nolberto Hanlon, MD  risperiDONE (RISPERDAL) 0.5 MG tablet Take 0.5 mg by mouth 3 (three) times daily.    [provider]  Sunscreens (CHAPSTICK EX) Apply 1 application topically 3 (three) times daily as needed (chapped lips).  [provider]  traMADol (ULTRAM) 50 MG tablet Take 50 mg by mouth every 12 (twelve) hours as needed for moderate pain.     [provider]  traZODone (DESYREL) 50 MG tablet Take 1 tablet (50 mg total) by mouth at bedtime. Patient taking differently: Take 75 mg by mouth at bedtime.  01/11/20   Pennie Banter, DO  venlafaxine (EFFEXOR) 75 MG tablet Take 1 tablet (75 mg total) by mouth 2 (two) times daily with a meal. 01/11/20   Esaw Grandchild A, DO  vitamin B-12 (CYANOCOBALAMIN) 1000 MCG tablet Take 1,000 mcg by mouth daily.    [provider]    Allergies Gabapentin and Pregabalin  Family History   Problem Relation Age of Onset  . Hypertension Mother     Social History Social History   Tobacco Use  . Smoking status: Unknown If Ever Smoked  . Smokeless tobacco: Never Used  Substance Use Topics  . Alcohol use: Never  . Drug use: Never    Review of Systems  Review of Systems  Unable to perform ROS: Dementia     ____________________________________________  PHYSICAL EXAM:      VITAL SIGNS: ED Triage Vitals  Enc Vitals Group     BP 05/31/20 1956 (!) 174/91     Pulse Rate 05/31/20 1956 (!) 51     Resp 05/31/20 1956 16     Temp 05/31/20 1956 98.2 F (36.8 C)     Temp Source 05/31/20 1956 Oral     SpO2 05/31/20 1956 97 %     Weight 05/31/20 1952 154 lb 5.2 oz (70 kg)     Height 05/31/20 1952 6' (1.829 m)     Head Circumference --      Peak Flow --      Pain Score --      Pain Loc --      Pain Edu? --      Excl. in GC? --      Physical Exam Vitals and nursing note reviewed.  Constitutional:      General: He is not in acute distress.    Appearance: He is well-developed.  HENT:     Head: Normocephalic and atraumatic.     Comments: Slight redness to forehead, no significant edema or hematoma appreciated. Eyes:     Conjunctiva/sclera: Conjunctivae normal.  Cardiovascular:     Rate and Rhythm: Normal rate and regular rhythm.     Heart sounds: Normal heart sounds.  Pulmonary:     Effort: Pulmonary effort is normal. No respiratory distress.     Breath sounds: No wheezing.  Abdominal:     General: There is no distension.  Musculoskeletal:     Cervical back: Neck supple.     Comments: Minimal TTP over left first and second metacarpals. No bruising or deformity.  Skin:    General: Skin is warm.     Capillary Refill: Capillary refill takes less than 2 seconds.     Findings: No rash.  Neurological:     Mental Status: He is alert. He is disoriented.     Motor: No abnormal muscle tone.       ____________________________________________   LABS (all labs  ordered are listed, but only abnormal results are displayed)  Labs Reviewed - No data to display  ____________________________________________  ________________________________________  RADIOLOGY All imaging, including plain films, CT scans, and ultrasounds, independently reviewed by me, and interpretations confirmed via formal radiology reads.  ED MD interpretation:  XR Hand: Negative CT Head: NAICA CT C Spine: Stable, no changes  Official radiology report(s): CT Head Wo Contrast  Result Date: 05/31/2020 CLINICAL DATA:  86 year old post fall from wheelchair striking face and head. EXAM: CT HEAD WITHOUT CONTRAST TECHNIQUE: Contiguous axial images were obtained from the base of the skull through the vertex without intravenous contrast. COMPARISON:  Head CT 03/09/2020 FINDINGS: Brain: Stable degree of atrophy and chronic small vessel ischemia. No intracranial hemorrhage, mass effect, or midline shift. No hydrocephalus. The basilar cisterns are patent. No evidence of territorial infarct or acute ischemia. No extra-axial or intracranial fluid collection. Vascular: Atherosclerosis of skullbase vasculature without hyperdense vessel or abnormal calcification. Skull: No fracture or focal lesion. Sinuses/Orbits: Chronic irregularity of the nasal bone. No acute findings. Included paranasal sinuses and mastoid air cells are clear bilateral lens extraction. Other: None. IMPRESSION: 1. No acute intracranial abnormality. No skull fracture. 2. Stable atrophy and chronic small vessel ischemia. Electronically Signed   By: Narda Rutherford M.D.   On: 05/31/2020 20:51   CT Cervical Spine Wo Contrast  Result Date: 05/31/2020 CLINICAL DATA:  Fall from wheelchair striking face and head. EXAM: CT CERVICAL SPINE WITHOUT CONTRAST TECHNIQUE: Multidetector CT imaging of the cervical spine was performed without intravenous contrast. Multiplanar CT image reconstructions were also generated. COMPARISON:  Cervical spine CT  03/10/2020 FINDINGS: Alignment: Stable degenerative anterolisthesis of C4 on C5 and C7 on T1. No traumatic subluxation. Skull base and vertebrae: No acute fracture. Stable cervical fusion at C5-C6 and C6-C7 with mild flattening of C6 vertebral body. The dens and skull base are intact. Soft tissues and spinal canal: No prevertebral fluid or swelling. No visible canal hematoma. Disc levels: Fusion at C5-C6 and C6-C7. Disc space narrowing and endplate spurring at C3-C4 and C4-C5. Unchanged multilevel facet hypertrophy. Upper chest: No acute or unexpected findings. Other: None. IMPRESSION: Stable degenerative and postsurgical change in the cervical spine without acute fracture or subluxation. Electronically Signed   By: Narda Rutherford M.D.   On: 05/31/2020 20:55   DG Hand Complete Left  Result Date: 05/31/2020 CLINICAL DATA:  Left hand pain after injury.  Fall from wheelchair. EXAM: LEFT HAND - COMPLETE 3+ VIEW COMPARISON:  None. FINDINGS: There is no evidence of fracture or dislocation. 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 Suggestion of non osseous lunotriquetral coalition, incidental. Soft tissues are unremarkable. IMPRESSION: 1. No acute fracture or subluxation of the left hand. 2. Multifocal osteoarthritis. Electronically Signed   By: Narda Rutherford M.D.   On: 05/31/2020 21:00    ____________________________________________  PROCEDURES   Procedure(s) performed (including Critical Care):  Procedures  ____________________________________________  INITIAL IMPRESSION / MDM / ASSESSMENT AND PLAN / ED COURSE  As part of my medical decision making, I reviewed the following data within the electronic MEDICAL RECORD NUMBER Nursing notes reviewed and incorporated, Old chart reviewed, Notes from prior ED visits, and [place] Controlled Substance Database       *Loras Grieshop was evaluated in Emergency Department on 06/01/2020 for the symptoms described in the history  of present illness. He was evaluated in the context of the global COVID-19 pandemic, which necessitated consideration that the patient might be at risk for infection with the SARS-CoV-2 virus that causes COVID-19. Institutional protocols and algorithms that pertain to the evaluation of patients at risk for COVID-19 are in a state of rapid change based on information released by regulatory bodies including the CDC and federal and state organizations. These policies and algorithms were  followed during the patient's care in the ED.  Some ED evaluations and interventions may be delayed as a result of limited staffing during the pandemic.*     Medical Decision Making:  86 yo M here with fall. On arrival, pt in no distress and has no signs of severe trauma. Discussed case with his wife via telephone. Given his reported head injury, CT head/Cspine obtained as well as plain films of his hand, all of which are fortunately negative. Patient is at his mental baseline. Per discussion with wife, will send back to facility.   ____________________________________________  FINAL CLINICAL IMPRESSION(S) / ED DIAGNOSES  Final diagnoses:  Fall, initial encounter     MEDICATIONS GIVEN DURING THIS VISIT:  Medications - No data to display   ED Discharge Orders    None       Note:  This document was prepared using Dragon voice recognition software and may include unintentional dictation errors.   Shaune Pollack, MD 06/01/20 [phone]

## 2020-11-07 ENCOUNTER — Emergency Department
Admission: EM | Admit: 2020-11-07 | Discharge: 2020-11-07 | Disposition: A | Discharge status: Home / Self Care | Payer: Medicare Other | Attending: Emergency Medicine | Admitting: Emergency Medicine

## 2020-11-07 ENCOUNTER — Other Ambulatory Visit: Payer: Self-pay

## 2020-11-07 ENCOUNTER — Emergency Department: Payer: Medicare Other

## 2020-11-07 DIAGNOSIS — U071 COVID-19: Secondary | ICD-10-CM | POA: Insufficient documentation

## 2020-11-07 DIAGNOSIS — F05 Delirium due to known physiological condition: Secondary | ICD-10-CM | POA: Insufficient documentation

## 2020-11-07 DIAGNOSIS — I10 Essential (primary) hypertension: Secondary | ICD-10-CM | POA: Diagnosis not present

## 2020-11-07 DIAGNOSIS — F0281 Dementia in other diseases classified elsewhere with behavioral disturbance: Secondary | ICD-10-CM | POA: Insufficient documentation

## 2020-11-07 DIAGNOSIS — I2510 Atherosclerotic heart disease of native coronary artery without angina pectoris: Secondary | ICD-10-CM | POA: Insufficient documentation

## 2020-11-07 DIAGNOSIS — Z79899 Other long term (current) drug therapy: Secondary | ICD-10-CM | POA: Insufficient documentation

## 2020-11-07 DIAGNOSIS — Z7982 Long term (current) use of aspirin: Secondary | ICD-10-CM | POA: Insufficient documentation

## 2020-11-07 DIAGNOSIS — R0902 Hypoxemia: Secondary | ICD-10-CM | POA: Diagnosis present

## 2020-11-07 NOTE — ED Notes (Signed)
Verbal from EDP Glenn Barker to hold on bloodwork until he talks with pt's wife [name] on phone.

## 2020-11-07 NOTE — ED Notes (Signed)
Verbal d/c from EDP Goodman on covid swab and blood-work.

## 2020-11-07 NOTE — ED Triage Notes (Signed)
Pt in via EMS from [facility] d/t SOB; 88% RA per EMS at nursing home. HTN with EMS and other VSS. Pt knows self; disoriented x3. Pt fluctuates from 89-98% RA; placed on 2L via [place]. Tears fill pt's eyes; when asked, pt denies major pain but reports is a little anxious. Reassured by this RN. NSR on monitor.

## 2020-11-07 NOTE — ED Notes (Signed)
Pt currently 98% RA. Pt knows self. Disoriented to location, year, and situation. Calmly laying in bed. RR 19. Resp reg/unlabored currently.

## 2020-11-07 NOTE — ED Notes (Signed)
Pt urinated large amount in briefs and saturated bed linens. Had small BM. Peri care provided. Briefs, bedpads, and linens changed. Changed into a clean gown. Repositioned. Bed locked low. Rails up. Call bell within reach. Pt denies any needs.

## 2020-11-07 NOTE — ED Notes (Signed)
Pt given two warm blankets.  

## 2020-11-07 NOTE — ED Notes (Signed)
EKG completed

## 2020-11-07 NOTE — ED Notes (Signed)
Called [facility] for transport to [facility]

## 2020-11-07 NOTE — ED Notes (Addendum)
[facility] RN called this RN to notify that pt's wife [name] doesn't want anything major completed on pt (including "blood-work and imaging") per Angelica Chessman, Charity fundraiser. Reports that pt's wife [name] would like for him to be d/c back to facility to have his symptoms managed there. States facility "freaked out" when they saw pt had fever of 100.4 with covid positive result so they sent him to the ER. Facility had given pt tylenol before he left for ER. Temp WDL upon arrival to ER. [facility] RN can be reached at [phone]. [name] can be reached at [phone]. Provider Derrill Kay notified by Diplomatic Services operational officer.

## 2020-11-07 NOTE — ED Notes (Signed)
Pt asleep.

## 2020-11-07 NOTE — ED Notes (Signed)
Pt resting calmly in bed. Offered drink, food, asked if needed to use restroom or wanted more warm blankets. Pt denied/refused all offers. Bed locked low. Rails up. Call bell within reach.

## 2020-11-07 NOTE — ED Notes (Signed)
Secretary setting up EMS transport.  

## 2020-11-07 NOTE — ED Notes (Signed)
Pillow adjusted for pt. HOB adjusted. Lights dimmed for pt. Bed locked low. Rails up. Call bell within reach.

## 2020-11-07 NOTE — ED Provider Notes (Signed)
[facility] Emergency Department Provider Note   ____________________________________________   I have reviewed the triage vital signs and the nursing notes.   HISTORY  Chief Complaint Shortness of Breath   History limited by and level 5 caveat due to: Dementia.   HPI Glenn Barker is a 86 y.o. male who presents to the emergency department today because of concern for hypoxia and shortness of breath. Per wife who I discussed this case with over the phone the patient was diagnosed with COVID today. Was then found to have fever and hypoxia. Patient is a hospice patient.   Records reviewed. Per medical record review patient has a history of CAD, dementia, HTN.   Past Medical History:  Diagnosis Date  . Coronary artery disease   . Dementia ([facility])   . Hypertension     Patient Active Problem List   Diagnosis Date Noted  . Acute delirium   . Unwitnessed fall   . Neck pain   . Hypokalemia   . Acute metabolic encephalopathy 01/09/2020  . Lactic acidosis   . Sepsis ([facility]) 01/07/2020  . Cellulitis of right lower extremity   . RUQ pain   . Dementia with behavioral disturbance ([facility])   . Essential hypertension     Past Surgical History:  Procedure Laterality Date  . NO PAST SURGERIES      Prior to Admission medications   Medication Sig Start Date End Date Taking? Authorizing Provider  acetaminophen (TYLENOL) 500 MG tablet Take 1,000 mg by mouth 3 (three) times daily.    [provider]  amLODipine (NORVASC) 10 MG tablet Take 10 mg by mouth daily.    [provider]  aspirin EC 81 MG tablet Take 81 mg by mouth every evening.    [provider]  atorvastatin (LIPITOR) 40 MG tablet Take 1 tablet (40 mg total) by mouth daily at 6 PM. 01/11/20   Pennie Banter, DO  bacitracin-polymyxin b (POLYSPORIN) ointment Apply 1 application topically daily. (apply to left shin)    [provider]  Camphor-Eucalyptus-Menthol (ICY HOT  NO-MESS VAPOR GEL EX) Apply 1 application topically 3 (three) times daily.    [provider]  cholecalciferol (VITAMIN D3) 25 MCG (1000 UT) tablet Take 2,000 Units by mouth daily.    [provider]  clopidogrel (PLAVIX) 75 MG tablet Take 75 mg by mouth daily.    [provider]  lisinopril (ZESTRIL) 20 MG tablet Take 20 mg by mouth daily.    [provider]  metoprolol succinate (TOPROL-XL) 50 MG 24 hr tablet Take 50 mg by mouth every evening.    [provider]  potassium chloride SA (K-DUR) 20 MEQ tablet Take 20 mEq by mouth 2 (two) times daily.    [provider]  PRESCRIPTION MEDICATION Apply 0.5 mg topically every 4 (four) hours as needed (agitation). ** lorazepam 0.5mg / 0.82ml topical gel **    [provider]  QUEtiapine (SEROQUEL) 25 MG tablet Take 1 tablet (25 mg total) by mouth at bedtime. 03/12/20   Lynn Ito, MD  risperiDONE (RISPERDAL) 0.5 MG tablet Take 0.5 mg by mouth 3 (three) times daily.    [provider]  Sunscreens (CHAPSTICK EX) Apply 1 application topically 3 (three) times daily as needed (chapped lips).     [provider]  traMADol (ULTRAM) 50 MG tablet Take 50 mg by mouth every 12 (twelve) hours as needed for moderate pain.     [provider]  traZODone (DESYREL)  50 MG tablet Take 1 tablet (50 mg total) by mouth at bedtime. Patient taking differently: Take 75 mg by mouth at bedtime.  01/11/20   Pennie Banter, DO  venlafaxine (EFFEXOR) 75 MG tablet Take 1 tablet (75 mg total) by mouth 2 (two) times daily with a meal. 01/11/20   Esaw Grandchild A, DO  vitamin B-12 (CYANOCOBALAMIN) 1000 MCG tablet Take 1,000 mcg by mouth daily.    [provider]    Allergies Gabapentin and Pregabalin  Family History  Problem Relation Age of Onset  . Hypertension Mother     Social History Social History   Tobacco Use  . Smoking status: Unknown If Ever Smoked  . Smokeless  tobacco: Never Used  Substance Use Topics  . Alcohol use: Never  . Drug use: Never    Review of Systems Unable to obtain reliable ROS secondary to dementia.  ____________________________________________   PHYSICAL EXAM:  VITAL SIGNS: ED Triage Vitals  Enc Vitals Group     BP 11/07/20 1627 (!) 175/77     Pulse Rate 11/07/20 1621 66     Resp 11/07/20 1623 18     Temp 11/07/20 1628 98.4 F (36.9 C)     Temp Source 11/07/20 1628 Oral     SpO2 11/07/20 1621 95 %     Weight 11/07/20 1632 150 lb (68 kg)     Height 11/07/20 1632 6' (1.829 m)     Head Circumference --      Peak Flow --      Pain Score 11/07/20 1632 0   Constitutional:  Eyes: Conjunctivae are normal.  ENT      Head: Normocephalic and atraumatic.      Nose: No congestion/rhinnorhea.      Mouth/Throat: Mucous membranes are moist.      Neck: No stridor. Hematological/Lymphatic/Immunilogical: No cervical lymphadenopathy. Cardiovascular: Normal rate, regular rhythm.  No murmurs, rubs, or gallops.  Respiratory: Normal respiratory effort without tachypnea nor retractions. Breath sounds are clear and equal bilaterally. No wheezes/rales/rhonchi. Gastrointestinal: Soft and non tender. No rebound. No guarding.  Genitourinary: Deferred Musculoskeletal: Normal range of motion in all extremities. No lower extremity edema. Neurologic:  Normal speech and language. No gross focal neurologic deficits are appreciated.  Skin:  Skin is warm, dry and intact. No rash noted. Psychiatric: Mood and affect are normal. Speech and behavior are normal. Patient exhibits appropriate insight and judgment.  ____________________________________________    LABS (pertinent positives/negatives)  None  ____________________________________________   EKG  I, Phineas Semen, attending physician, personally viewed and interpreted this EKG  EKG Time: 1625 Rate: 68 Rhythm: sinus rhythm Axis: left axis deviation Intervals: qtc 421 QRS:  narrow, q waves v1 ST changes: no st elevation Impression: abnormal ekg   ____________________________________________    RADIOLOGY  CXR Rotated study. Probable cardiomegaly and atelectasis   ____________________________________________   PROCEDURES  Procedures  ____________________________________________   INITIAL IMPRESSION / ASSESSMENT AND PLAN / ED COURSE  Pertinent labs & imaging results that were available during my care of the patient were reviewed by me and considered in my medical decision making (see chart for details).   Presents from living facility today because of concerns for hypoxia and fever in setting of recent Covid diagnosis.  Patient arrived and was and able to give any history.  Chest x-ray and blood work were ordered.  I was then able to contact wife on the phone.  She did request that the patient be discharged back to the living  facility.  She did not want blood work to be run.  I did discuss with patient's wife that typically for Covid patients that are hypoxic we do admit for oxygen antivirals and steroids.  Patient's wife however declined treatment.  She understands that this could be a fatal diagnosis.  ___________________________________________   FINAL CLINICAL IMPRESSION(S) / ED DIAGNOSES  Final diagnoses:  COVID-19     Note: This dictation was prepared with Dragon dictation. Any transcriptional errors that result from this process are unintentional     Phineas Semen, MD 11/07/20 1728

## 2020-12-13 DEATH — deceased

## 2020-12-14 IMAGING — DX DG CHEST 1V PORT
1 series · 1 of 1 positions shown · non-contrast
Comparison: March 09, 2020

CLINICAL DATA: Fever

EXAM:
PORTABLE CHEST 1 VIEW

[chest ap]
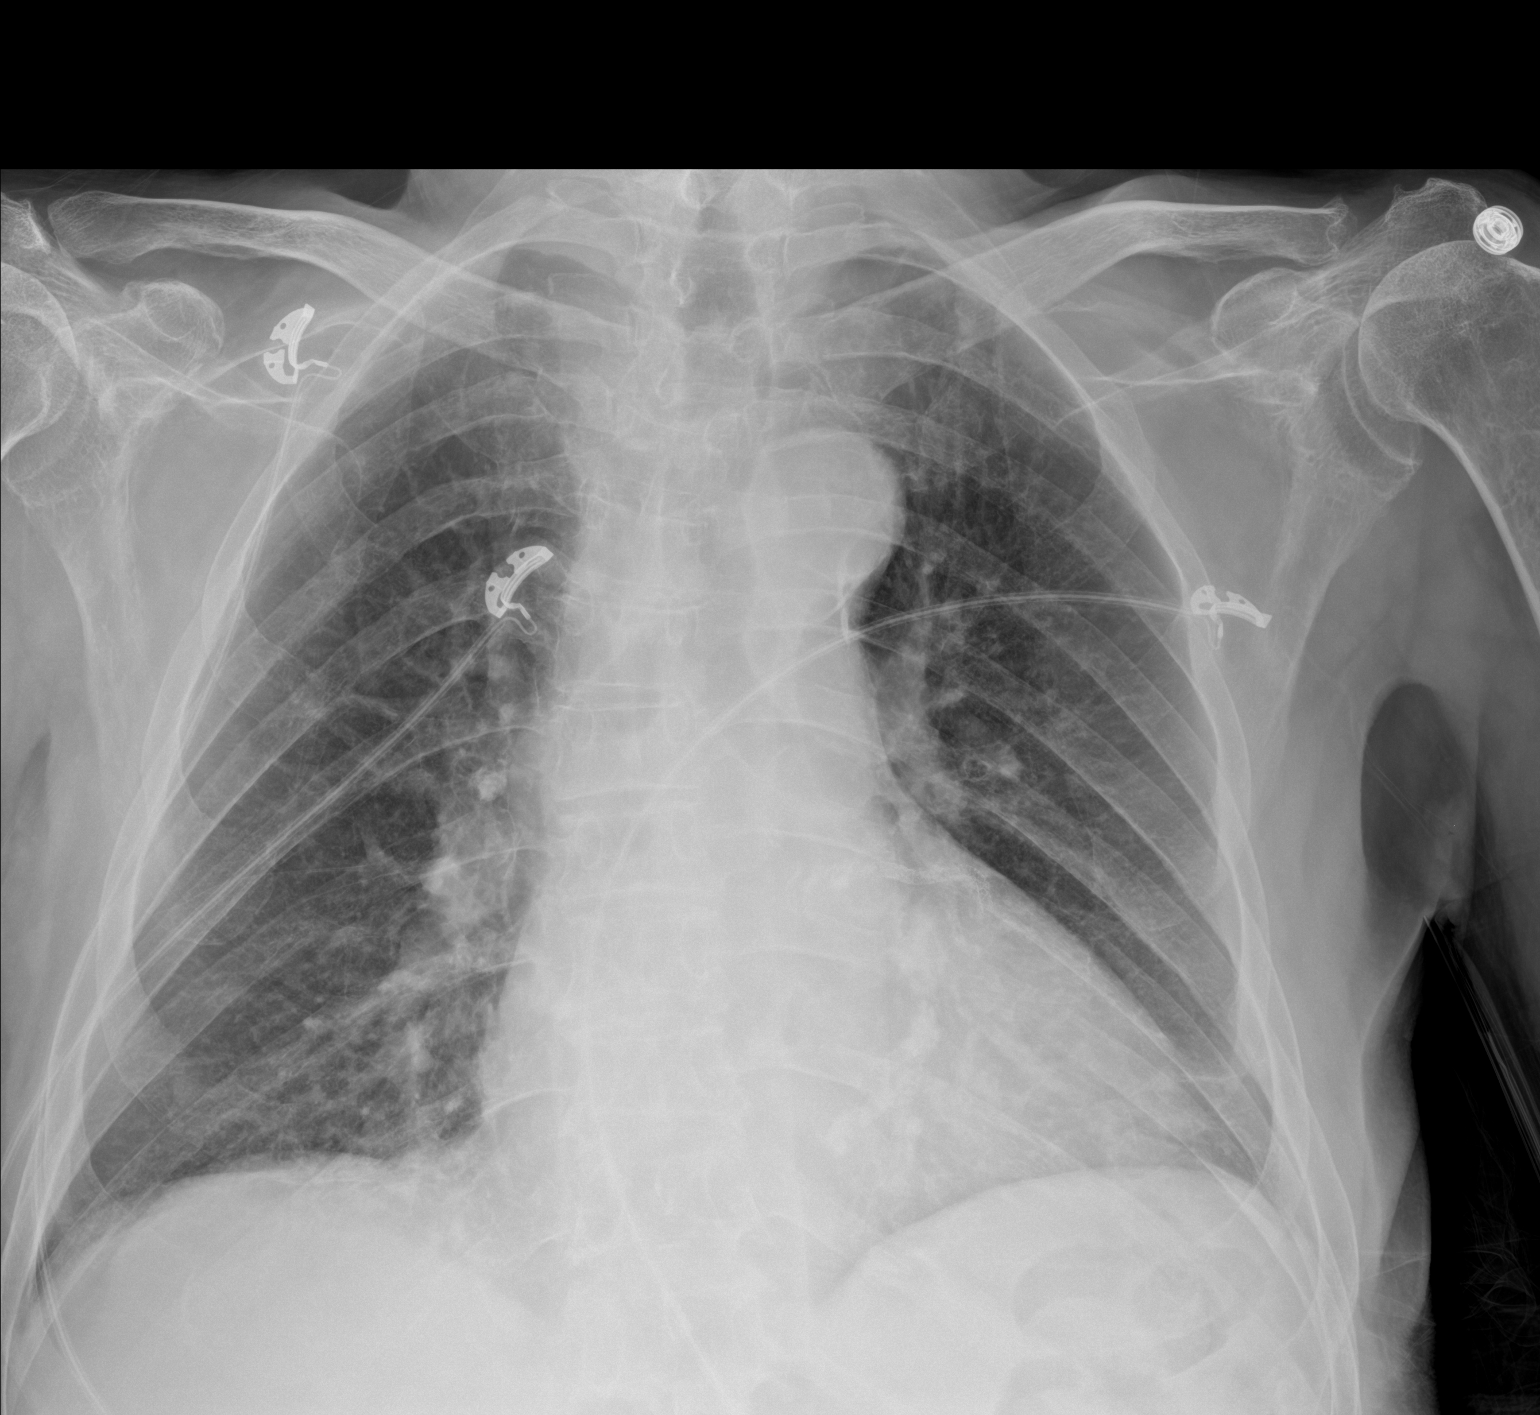

[1 of 1 positions shown; findings below may reference images not displayed]

FINDINGS: There is mild left base atelectasis. Lungs otherwise are clear.
Heart is enlarged with pulmonary vascularity normal. No adenopathy.
There is aortic atherosclerosis. There is calcification in the
mitral annulus. Postoperative changes noted in the lower cervical
spine.
IMPRESSION: Mild left base atelectasis. Lungs elsewhere clear. Stable
cardiomegaly. Aortic Atherosclerosis ([id number]).

## 2021-08-12 IMAGING — DX DG CHEST 1V PORT
1 series · 1 of 1 positions shown · non-contrast
Comparison: 03/11/2020

CLINICAL DATA: Shortness of breath

EXAM:
PORTABLE CHEST 1 VIEW

[chest ap]
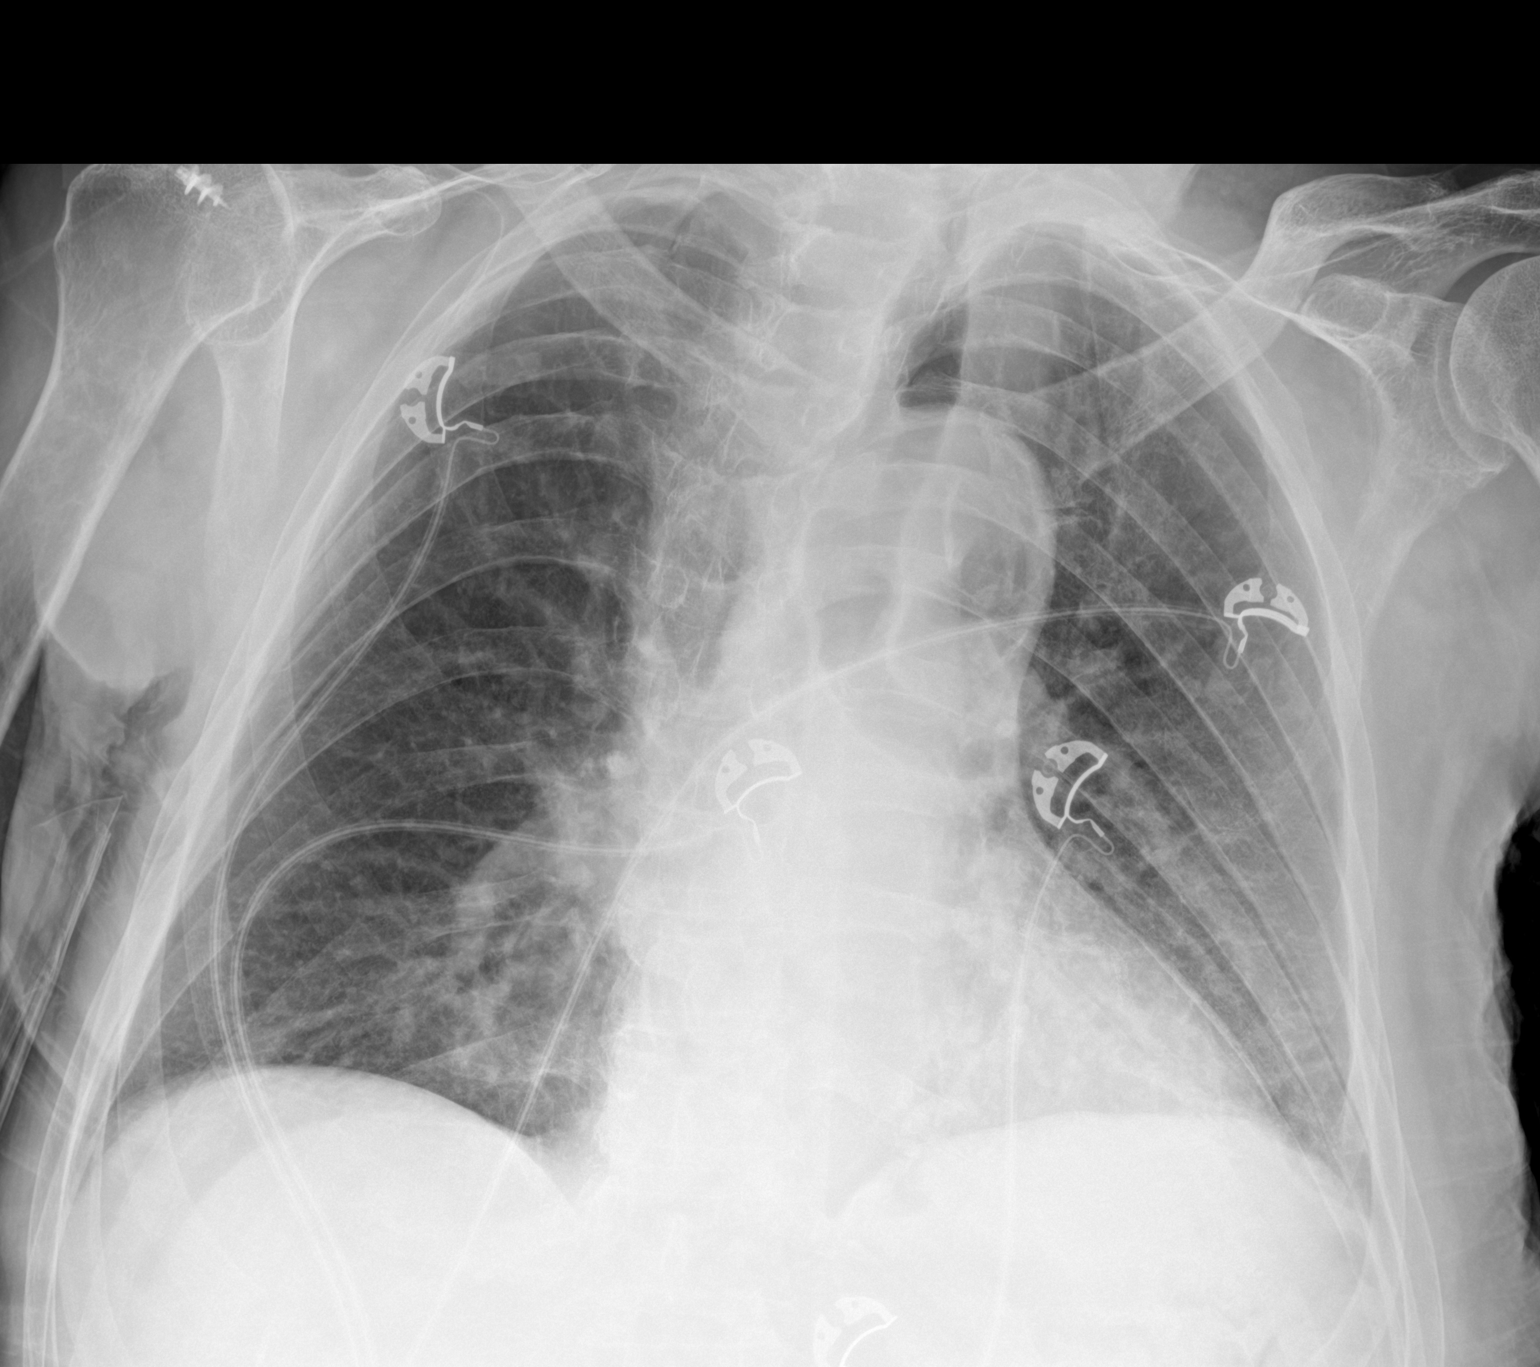

[1 of 1 positions shown; findings below may reference images not displayed]

FINDINGS: Rotated patient. Probable mild cardiomegaly. Atelectasis left base.
No consolidation, pleural effusion or pneumothorax. Aortic
atherosclerosis. Mediastinal silhouette likely exaggerated by
patient rotation.
IMPRESSION: Rotated patient with probable mild cardiomegaly and left basilar
atelectasis.
